# Patient Record
Sex: Female | Born: 1955 | Race: White | Hispanic: No | Marital: Married | Smoking: Current every day smoker
Health system: Southern US, Community
[De-identification: ages and names within clinical notes are randomized; demographics above are authoritative.]

## PROBLEM LIST (undated history)

## (undated) DIAGNOSIS — F329 Major depressive disorder, single episode, unspecified: Secondary | ICD-10-CM

## (undated) DIAGNOSIS — G43909 Migraine, unspecified, not intractable, without status migrainosus: Secondary | ICD-10-CM

## (undated) DIAGNOSIS — S060XAA Concussion with loss of consciousness status unknown, initial encounter: Secondary | ICD-10-CM

## (undated) DIAGNOSIS — E119 Type 2 diabetes mellitus without complications: Secondary | ICD-10-CM

## (undated) DIAGNOSIS — S060X9A Concussion with loss of consciousness of unspecified duration, initial encounter: Secondary | ICD-10-CM

## (undated) DIAGNOSIS — R42 Dizziness and giddiness: Secondary | ICD-10-CM

## (undated) DIAGNOSIS — C801 Malignant (primary) neoplasm, unspecified: Secondary | ICD-10-CM

## (undated) DIAGNOSIS — F419 Anxiety disorder, unspecified: Secondary | ICD-10-CM

## (undated) DIAGNOSIS — R5383 Other fatigue: Secondary | ICD-10-CM

## (undated) DIAGNOSIS — F32A Depression, unspecified: Secondary | ICD-10-CM

## (undated) HISTORY — PX: CHOLECYSTECTOMY: SHX55

## (undated) HISTORY — DX: Major depressive disorder, single episode, unspecified: F32.9

## (undated) HISTORY — DX: Depression, unspecified: F32.A

## (undated) HISTORY — DX: Concussion with loss of consciousness status unknown, initial encounter: S06.0XAA

## (undated) HISTORY — PX: ABDOMINAL HYSTERECTOMY: SHX81

## (undated) HISTORY — DX: Anxiety disorder, unspecified: F41.9

## (undated) HISTORY — DX: Concussion with loss of consciousness of unspecified duration, initial encounter: S06.0X9A

## (undated) HISTORY — DX: Other fatigue: R53.83

## (undated) HISTORY — PX: TRIGGER FINGER RELEASE: SHX641

## (undated) HISTORY — PX: FOOT SURGERY: SHX648

---

## 1999-01-13 ENCOUNTER — Encounter: Payer: Self-pay | Admitting: Neurosurgery

## 1999-01-13 ENCOUNTER — Ambulatory Visit (HOSPITAL_COMMUNITY): Admission: RE | Admit: 1999-01-13 | Discharge: 1999-01-13 | Payer: Self-pay | Admitting: Neurosurgery

## 2013-09-08 ENCOUNTER — Encounter (HOSPITAL_BASED_OUTPATIENT_CLINIC_OR_DEPARTMENT_OTHER): Payer: Self-pay | Admitting: Emergency Medicine

## 2013-09-08 ENCOUNTER — Emergency Department (HOSPITAL_BASED_OUTPATIENT_CLINIC_OR_DEPARTMENT_OTHER)
Admission: EM | Admit: 2013-09-08 | Discharge: 2013-09-09 | Disposition: A | Discharge status: Home / Self Care | Payer: BC Managed Care – PPO | Attending: Emergency Medicine | Admitting: Emergency Medicine

## 2013-09-08 DIAGNOSIS — J029 Acute pharyngitis, unspecified: Secondary | ICD-10-CM | POA: Insufficient documentation

## 2013-09-08 DIAGNOSIS — R51 Headache: Secondary | ICD-10-CM

## 2013-09-08 DIAGNOSIS — H9209 Otalgia, unspecified ear: Secondary | ICD-10-CM | POA: Insufficient documentation

## 2013-09-08 DIAGNOSIS — G43909 Migraine, unspecified, not intractable, without status migrainosus: Secondary | ICD-10-CM | POA: Insufficient documentation

## 2013-09-08 DIAGNOSIS — R519 Headache, unspecified: Secondary | ICD-10-CM

## 2013-09-08 DIAGNOSIS — Z79899 Other long term (current) drug therapy: Secondary | ICD-10-CM | POA: Insufficient documentation

## 2013-09-08 DIAGNOSIS — E119 Type 2 diabetes mellitus without complications: Secondary | ICD-10-CM | POA: Insufficient documentation

## 2013-09-08 DIAGNOSIS — F17200 Nicotine dependence, unspecified, uncomplicated: Secondary | ICD-10-CM | POA: Insufficient documentation

## 2013-09-08 HISTORY — DX: Migraine, unspecified, not intractable, without status migrainosus: G43.909

## 2013-09-08 HISTORY — DX: Type 2 diabetes mellitus without complications: E11.9

## 2013-09-08 NOTE — ED Provider Notes (Addendum)
CSN: [id number]     Arrival date & time 09/08/13  2141 History   This chart was scribed for Lilyana Lippman Alfonso Patten, MD by Roxan Diesel, ED scribe.  This patient was seen in room MH01/MH01 and the patient's care was started at 11:59 PM.    Chief Complaint  Patient presents with  . Migraine    Patient is a 58 y.o. female presenting with headaches. The history is provided by the patient. No language interpreter was used.  Headache Pain location:  Generalized Quality:  Dull Radiates to:  Does not radiate Onset quality:  Gradual Duration: 4-5 months. Timing:  Intermittent Progression:  Unchanged Chronicity:  Recurrent (recurrent for past 4-5 months) Similar to prior headaches: yes   Context: not activity   Relieved by:  Nothing Worsened by:  Nothing tried Ineffective treatments: norco. Associated symptoms: ear pain, myalgias and sore throat   Associated symptoms: no cough, no diarrhea, no fever, no neck stiffness, no numbness, no photophobia, no swollen glands and no vomiting   Risk factors: no anger     HPI Comments: Julie Chen is a 58 y.o. female who presents to the Emergency Department complaining of a recurrent headaches that have been going on for the past 4 months. She also reports a sore throat, ear ache and generalized body aches. Patient reports taking OTC medications for these symptoms without relief. She denies vomiting, diarrhea, constipation, or rash.  She states that she takes Vicodin daily, typically before she goes to bed. She denies taking OTC ibuprofen or Aleve.   Past Medical History  Diagnosis Date  . Migraine   . Diabetes mellitus without complication    Past Surgical History  Procedure Laterality Date  . Abdominal hysterectomy    . Cholecystectomy    . Trigger finger release     History reviewed. No pertinent family history. History  Substance Use Topics  . Smoking status: Current Every Day Smoker  . Smokeless tobacco: Never Used  . Alcohol Use:  Yes   OB History   Grav Para Term Preterm Abortions TAB SAB Ect Mult Living                 Review of Systems  Constitutional: Negative for fever.  HENT: Positive for ear pain and sore throat. Negative for trouble swallowing and voice change.   Eyes: Negative for photophobia.  Respiratory: Negative for cough.   Gastrointestinal: Negative for vomiting, diarrhea and constipation.  Musculoskeletal: Positive for myalgias. Negative for neck stiffness.  Skin: Negative for rash.  Neurological: Positive for headaches. Negative for weakness and numbness.  All other systems reviewed and are negative.     Allergies  Phenergan and Sulfa antibiotics  Home Medications   Prior to Admission medications   Medication Sig Start Date End Date Taking? Authorizing Provider  escitalopram (LEXAPRO) 20 MG tablet Take 20 mg by mouth daily.   Yes Historical Provider, MD  folic acid (FOLVITE) 1 MG tablet Take 1 mg by mouth daily.   Yes Historical Provider, MD  gabapentin (NEURONTIN) 300 MG capsule Take 300 mg by mouth 4 (four) times daily.   Yes Historical Provider, MD  hydrOXYzine (VISTARIL) 25 MG capsule Take 25 mg by mouth 3 (three) times daily as needed.   Yes Historical Provider, MD  magnesium 30 MG tablet Take 250 mg by mouth 2 (two) times daily.   Yes Historical Provider, MD  meclizine (ANTIVERT) 25 MG tablet Take 25 mg by mouth 3 (three) times daily as needed for  dizziness.   Yes Historical Provider, MD  montelukast (SINGULAIR) 10 MG tablet Take 10 mg by mouth at bedtime.   Yes Historical Provider, MD  omeprazole (PRILOSEC) 20 MG capsule Take 20 mg by mouth daily.   Yes Historical Provider, MD  rosuvastatin (CRESTOR) 40 MG tablet Take 40 mg by mouth daily.   Yes Historical Provider, MD  vitamin A 7500 UNIT capsule Take 7,500 Units by mouth daily.   Yes Historical Provider, MD   BP 101/64  Pulse 102  Temp(Src) 98.4 F (36.9 C) (Oral)  Resp 18  SpO2 98% Physical Exam  Nursing note and vitals  reviewed. Constitutional: She is oriented to person, place, and time. She appears well-developed and well-nourished. No distress.  Cognition and phonation intact  HENT:  Head: Normocephalic and atraumatic. Head is without raccoon's eyes and without Battle's sign.  Right Ear: Tympanic membrane, external ear and ear canal normal. No mastoid tenderness. No hemotympanum.  Left Ear: Tympanic membrane, external ear and ear canal normal. No mastoid tenderness. No hemotympanum.  Mouth/Throat: Oropharynx is clear and moist and mucous membranes are normal. No trismus in the jaw. No uvula swelling. No oropharyngeal exudate.  no tenderness of the mastoids No swelling to lips, ears or nose  Eyes: Conjunctivae and EOM are normal. Pupils are equal, round, and reactive to light.  Neck: Normal range of motion. Neck supple. No tracheal deviation present.  No pain with displacement of the trachea  Cardiovascular: Normal rate and regular rhythm.   No lymph nodes.   Pulmonary/Chest: Effort normal and breath sounds normal. No stridor. No respiratory distress. She has no wheezes. She has no rales.  Abdominal: Soft. Bowel sounds are normal. There is no tenderness. There is no rebound and no guarding.  Musculoskeletal: Normal range of motion. She exhibits no edema and no tenderness.  Lymphadenopathy:    She has no cervical adenopathy.  Neurological: She is alert and oriented to person, place, and time. She has normal reflexes. No cranial nerve deficit.  Skin: Skin is warm and dry.  Psychiatric: She has a normal mood and affect. Her behavior is normal.    ED Course  Procedures (including critical care time) DIAGNOSTIC STUDIES: Oxygen Saturation is 98% on room air, normal by my interpretation.    COORDINATION OF CARE: 12:05 AM-Discussed treatment plan which includes neck x-ray, head CT, and labs with pt at bedside and pt agreed to plan.     Labs Review Labs Reviewed - No data to display  Imaging  Review No results found.   EKG Interpretation None      MDM   Final diagnoses:  None  No signs of trauma from fall or sinus infection on head CT.  No meningismus.  No indication for LP.  No  Concern for dural sinus thrombosis as able to look in all directions and and no changes in cognition.    Symptoms have been going on for months.  Both head CT and soft tissue neck xray are negative.  Strep is negative.  No lymph nodes.  No pain with displacement of the trachea.  Centor criteria negative.  No indication for antibiotics at this time.  Recommend follow up in 2 days with your family doctor, prn follow up with ENT if symptoms persist.  Patient is taking daily norco for the past 4-5 months for her symptoms  I personally performed the services described in this documentation, which was scribed in my presence. The recorded information has been reviewed and is  accurate. 2 scribes present for this exam and physical    Jaelan Rasheed K Tymarion Everard-Rasch, MD 09/09/13 0240  Judia Arnott K Jachin Coury-Rasch, MD 09/09/13 [phone]

## 2013-09-08 NOTE — ED Notes (Signed)
Pt reports migraine headache since fall Monday PM denies blurred vision or other nero symptoms

## 2013-09-09 ENCOUNTER — Emergency Department (HOSPITAL_BASED_OUTPATIENT_CLINIC_OR_DEPARTMENT_OTHER): Payer: BC Managed Care – PPO

## 2013-09-09 LAB — RAPID STREP SCREEN (MED CTR MEBANE ONLY): STREPTOCOCCUS, GROUP A SCREEN (DIRECT): NEGATIVE

## 2013-09-09 MED ORDER — KETOROLAC TROMETHAMINE 60 MG/2ML IM SOLN
60.0000 mg | Freq: Once | INTRAMUSCULAR | Status: AC
  Administered 2013-09-09: 60 mg via INTRAMUSCULAR
  Filled 2013-09-09: qty 2

## 2013-09-09 MED ORDER — SUCRALFATE 1 GM/10ML PO SUSP: 1.0000 g | Freq: Three times a day (TID) | ORAL | Status: DC

## 2013-09-09 MED ORDER — DEXAMETHASONE SODIUM PHOSPHATE 10 MG/ML IJ SOLN
10.0000 mg | Freq: Once | INTRAMUSCULAR | Status: AC
  Administered 2013-09-09: 10 mg via INTRAMUSCULAR
  Filled 2013-09-09: qty 1

## 2013-09-09 MED ORDER — DIPHENHYDRAMINE HCL 50 MG/ML IJ SOLN
12.5000 mg | Freq: Once | INTRAMUSCULAR | Status: AC
  Administered 2013-09-09: 12.5 mg via INTRAMUSCULAR
  Filled 2013-09-09: qty 1

## 2013-09-09 MED ORDER — METOCLOPRAMIDE HCL 5 MG/ML IJ SOLN
10.0000 mg | Freq: Once | INTRAMUSCULAR | Status: AC
  Administered 2013-09-09: 10 mg via INTRAMUSCULAR
  Filled 2013-09-09: qty 2

## 2013-09-09 MED ORDER — MELOXICAM 7.5 MG PO TABS: 7.5000 mg | ORAL_TABLET | Freq: Every day | ORAL | Status: DC

## 2013-09-10 LAB — CULTURE, GROUP A STREP

## 2013-10-01 ENCOUNTER — Encounter: Payer: Self-pay | Admitting: Neurology

## 2013-10-01 ENCOUNTER — Ambulatory Visit (INDEPENDENT_AMBULATORY_CARE_PROVIDER_SITE_OTHER): Payer: BC Managed Care – PPO | Admitting: Neurology

## 2013-10-01 VITALS — Ht 67.0 in | Wt 171.0 lb

## 2013-10-01 DIAGNOSIS — S060X9A Concussion with loss of consciousness of unspecified duration, initial encounter: Secondary | ICD-10-CM | POA: Insufficient documentation

## 2013-10-01 DIAGNOSIS — R55 Syncope and collapse: Secondary | ICD-10-CM | POA: Insufficient documentation

## 2013-10-01 DIAGNOSIS — F411 Generalized anxiety disorder: Secondary | ICD-10-CM

## 2013-10-01 DIAGNOSIS — F419 Anxiety disorder, unspecified: Secondary | ICD-10-CM

## 2013-10-01 DIAGNOSIS — F3289 Other specified depressive episodes: Secondary | ICD-10-CM

## 2013-10-01 DIAGNOSIS — F329 Major depressive disorder, single episode, unspecified: Secondary | ICD-10-CM

## 2013-10-01 DIAGNOSIS — F32A Depression, unspecified: Secondary | ICD-10-CM

## 2013-10-01 DIAGNOSIS — R5381 Other malaise: Secondary | ICD-10-CM

## 2013-10-01 DIAGNOSIS — S060XAA Concussion with loss of consciousness status unknown, initial encounter: Secondary | ICD-10-CM | POA: Insufficient documentation

## 2013-10-01 DIAGNOSIS — R5383 Other fatigue: Secondary | ICD-10-CM | POA: Insufficient documentation

## 2013-10-01 DIAGNOSIS — G43909 Migraine, unspecified, not intractable, without status migrainosus: Secondary | ICD-10-CM | POA: Insufficient documentation

## 2013-10-01 MED ORDER — RIZATRIPTAN BENZOATE 5 MG PO TBDP: 5.0000 mg | ORAL_TABLET | ORAL | Status: AC | PRN

## 2013-10-01 NOTE — Progress Notes (Signed)
PATIENT: Julie Chen DOB: Feb 24, 1956  HISTORICAL  Tasheema Perrone is a 58 yo left handed female, is referred by her primary care physician Dr. 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  She began to complains of depression, anxiety, frequent headaches, often starting from occipital, bilateral upper shoulder, spreading forward, to bilateral frontal area, pressure, 3-5 times each week, it would exacerbated to a much more severe headaches, severe pounding, with associated light noise sensitivity, she is taking hydrocodone as needed, has never tried triptan treatment in the past, she reported a history of migraine headaches when she was young.  She is taking Lexapro 20 mg daily, also taking Xanax 3-4 times each day, which helps her somewhat,  She had 4 passing out episodes over past 1 month, since June 2015, one episode is associated with hyperventilation, panicky feelings, chest pain, the other episode is associated as are unclear triggers, she was sitting there watching TV, was found by her husband on the floor.  There was one episode happened after blood sample was taken  Most bothersome symptoms are constant neck pain, upper shoulder area discomfort, which has been helped by chiropractor  REVIEW OF SYSTEMS: Full 14 system review of systems performed and notable only for aching muscles, allergies, headaches, numbness, dizziness, passing out, sleepiness, depression, anxiety, decreased energy, change in appetite, disinterested in activities,  ALLERGIES: Allergies  Allergen Reactions  . Phenergan [Promethazine Hcl]   . Sulfa Antibiotics     HOME MEDICATIONS: Current Outpatient Prescriptions on File Prior to Visit  Medication Sig Dispense Refill  . escitalopram (LEXAPRO)  20 MG tablet Take 20 mg by mouth daily.      . folic acid (FOLVITE) 1 MG tablet Take 1 mg by mouth daily.      Marland Kitchen gabapentin (NEURONTIN) 300 MG capsule Take 300 mg by mouth 4 (four) times daily.      . hydrOXYzine (VISTARIL) 25 MG capsule Take 25 mg by mouth 3 (three) times daily as needed.      . magnesium 30 MG tablet Take 250 mg by mouth 2 (two) times daily.      . meclizine (ANTIVERT) 25 MG tablet Take 25 mg by mouth 3 (three) times daily as needed for dizziness.      . montelukast (SINGULAIR) 10 MG tablet Take 10 mg by mouth at bedtime.      . rosuvastatin (CRESTOR) 40 MG tablet Take 40 mg by mouth daily.      . vitamin A 7500 UNIT capsule Take 7,500 Units by mouth daily.       No current facility-administered medications on file prior to visit.    PAST MEDICAL HISTORY: Past Medical History  Diagnosis Date  . Migraine   . Diabetes mellitus without complication   . Concussion   . Depression   . Anxiety   . Fatigue     PAST SURGICAL HISTORY: Past Surgical History  Procedure Laterality Date  . Abdominal hysterectomy    . Cholecystectomy    . Trigger finger release    . Cesarean section      X2  . Foot surgery Bilateral     FAMILY HISTORY: Family History  Problem Relation Age of Onset  . Clotting disorder Mother   . Alzheimer's disease Father   .  Heart Problems Brother   . Stroke Mother   . Osteoporosis Mother   . Migraines Father     SOCIAL HISTORY:  History   Social History  . Marital Status: Married    Spouse Name: Lewis    Number of Children: 2  . Years of Education: 12th   Occupational History    [employer]   Social History Main Topics  . Smoking status: Current Every Day Smoker  . Smokeless tobacco: Never Used  . Alcohol Use: No  . Drug Use: No  . Sexual Activity: Not on file   Other Topics Concern  . Not on file   Social History Narrative   Patient lives at home with her husband Bobby Rumpf). Patient works full time for [employer].    Education high school.   Left handed.   Caffeine two glasses daily.     PHYSICAL EXAM   Filed Vitals:   10/01/13 1008  Height: 5\' 7"  (1.702 m)  Weight: 171 lb (77.565 kg)    Not recorded    Body mass index is 26.78 kg/(m^2).   Generalized: In no acute distress  Neck: Supple, no carotid bruits   Cardiac: Regular rate rhythm  Pulmonary: Clear to auscultation bilaterally  Musculoskeletal: No deformity  Neurological examination  Mentation: Alert oriented to time, place, history taking, and causual conversation, tearful during today's conversation  Cranial nerve II-XII: Pupils were equal round reactive to light. Extraocular movements were full.  Visual field were full on confrontational test. Bilateral fundi were sharp.  Facial sensation and strength were normal. Hearing was intact to finger rubbing bilaterally. Uvula tongue midline.  Head turning and shoulder shrug and were normal and symmetric.Tongue protrusion into cheek strength was normal.  Motor: Normal tone, bulk and strength.  Sensory: Intact to fine touch, pinprick, preserved vibratory sensation, and proprioception at toes.  Coordination: Normal finger to nose, heel-to-shin bilaterally there was no truncal ataxia  Gait: Rising up from seated position without assistance, normal stance, without trunk ataxia, moderate stride, good arm swing, smooth turning, able to perform tiptoe, and heel walking without difficulty.   Romberg signs: Negative  Deep tendon reflexes: Brachioradialis 2/2, biceps 2/2, triceps 2/2, patellar 2/2, Achilles 2/2, plantar responses were flexor bilaterally.   DIAGNOSTIC DATA (LABS, IMAGING, TESTING) - I reviewed patient records, labs, notes, testing and imaging myself where available.   ASSESSMENT AND PLAN  Skyy Nilan is a 58 y.o. female complains of episodes of passing out, suggestive of syncope, frequent headaches, with some migraine features,  1, complete evaluation with MRI of the  brain with and without contrast, to rule out structural lesion in a 58 years old, with new onset episode of sudden known loss of consciousness, EEG 2. Keep current medications, including Prozac, Xanax, 3. Return to clinic in one month, 4. Maxalt as needed for migraine     Marcial Pacas, M.D. Ph.D.  [facility] [address], [place], [place] [address] [phone]

## 2013-10-08 ENCOUNTER — Ambulatory Visit (INDEPENDENT_AMBULATORY_CARE_PROVIDER_SITE_OTHER): Payer: BC Managed Care – PPO | Admitting: Radiology

## 2013-10-08 ENCOUNTER — Other Ambulatory Visit: Payer: BC Managed Care – PPO | Admitting: Radiology

## 2013-10-08 DIAGNOSIS — R55 Syncope and collapse: Secondary | ICD-10-CM

## 2013-10-08 DIAGNOSIS — R5383 Other fatigue: Secondary | ICD-10-CM

## 2013-10-08 DIAGNOSIS — F32A Depression, unspecified: Secondary | ICD-10-CM

## 2013-10-08 DIAGNOSIS — F329 Major depressive disorder, single episode, unspecified: Secondary | ICD-10-CM

## 2013-10-08 DIAGNOSIS — F419 Anxiety disorder, unspecified: Secondary | ICD-10-CM

## 2013-10-08 NOTE — Procedures (Signed)
   HISTORY: 58 years old female complaints of passing out episode frequent headaches  TECHNIQUE:  16 channel EEG was performed based on standard 10-16 international system. One channel was dedicated to EKG, which has demonstrates normal sinus rhythm of 72 beats per minutes.  Upon awakening, the posterior background activity was well-developed, in alpha range, 9-10 Hz, with amplitude of 45 microvoltage, reactive to eye opening and closure.  There was no evidence of epileptiform discharge.  Photic stimulation was performed, which induced a symmetric photic driving.  Hyperventilation was performed, there was no abnormality elicit.  Stage II sleep was achieved.  CONCLUSION: This is a  normal awake and sleep EEG.  There is no electrodiagnostic evidence of epileptiform discharge

## 2013-10-13 DIAGNOSIS — R51 Headache: Secondary | ICD-10-CM

## 2013-10-17 ENCOUNTER — Other Ambulatory Visit: Payer: Self-pay | Admitting: Diagnostic Neuroimaging

## 2013-10-17 DIAGNOSIS — F329 Major depressive disorder, single episode, unspecified: Secondary | ICD-10-CM

## 2013-10-17 DIAGNOSIS — F419 Anxiety disorder, unspecified: Secondary | ICD-10-CM

## 2013-10-17 DIAGNOSIS — R55 Syncope and collapse: Secondary | ICD-10-CM

## 2013-10-17 DIAGNOSIS — F32A Depression, unspecified: Secondary | ICD-10-CM

## 2013-10-17 DIAGNOSIS — R5383 Other fatigue: Secondary | ICD-10-CM

## 2013-10-18 ENCOUNTER — Telehealth: Payer: Self-pay | Admitting: Neurology

## 2013-10-18 NOTE — Telephone Encounter (Signed)
Patient requesting EEG results. °

## 2013-10-18 NOTE — Telephone Encounter (Signed)
Kindly inform patient that EEG is normal

## 2013-10-18 NOTE — Telephone Encounter (Signed)
Pt calling requesting EEG results. Dr. Krista Blue out of the office. Sending to [facility], Dr. Leonie Man, please advise

## 2013-10-21 NOTE — Telephone Encounter (Signed)
Called pt to inform her per Dr. Leonie Man, Dr. Krista Blue out of the office, that the pt's EEG results were normal. Pt is also requesting MRI results and sent to pt's MyChart.  I advised the pt that if she has any other problems, questions or concerns to call the office. Pt verbalized understanding. Please advise

## 2013-10-21 NOTE — Telephone Encounter (Signed)
Please call patient. MRI brain showed no significant abnormalities, further questions can be addressed at her follow up visit in August 7th    MRI brain (with and without) demonstrating: 1. Minimal periventricular and subcortical foci of non-specific gliosis.  2. No abnormal lesions are seen on post contrast views.  3. No acute findings.

## 2013-10-21 NOTE — Telephone Encounter (Signed)
Called pt to inform her per Dr. Krista Blue that her MRI results showed no significant abnormalities and further questions can be addressed at pt f/u visit on 11/01/13. I advised the pt that if she has any other problems, questions or concerns to call the office. Pt verbalized understanding.

## 2013-10-31 NOTE — Telephone Encounter (Signed)
Noted  

## 2013-11-01 ENCOUNTER — Ambulatory Visit: Payer: BC Managed Care – PPO | Admitting: Neurology

## 2014-01-10 ENCOUNTER — Other Ambulatory Visit: Payer: Self-pay

## 2014-06-01 ENCOUNTER — Encounter (HOSPITAL_BASED_OUTPATIENT_CLINIC_OR_DEPARTMENT_OTHER): Payer: Self-pay | Admitting: *Deleted

## 2014-06-01 ENCOUNTER — Emergency Department (HOSPITAL_BASED_OUTPATIENT_CLINIC_OR_DEPARTMENT_OTHER)
Admission: EM | Admit: 2014-06-01 | Discharge: 2014-06-01 | Disposition: A | Discharge status: Home / Self Care | Payer: No Typology Code available for payment source | Attending: Emergency Medicine | Admitting: Emergency Medicine

## 2014-06-01 DIAGNOSIS — Z720 Tobacco use: Secondary | ICD-10-CM | POA: Diagnosis not present

## 2014-06-01 DIAGNOSIS — F329 Major depressive disorder, single episode, unspecified: Secondary | ICD-10-CM | POA: Insufficient documentation

## 2014-06-01 DIAGNOSIS — F419 Anxiety disorder, unspecified: Secondary | ICD-10-CM | POA: Insufficient documentation

## 2014-06-01 DIAGNOSIS — E119 Type 2 diabetes mellitus without complications: Secondary | ICD-10-CM | POA: Diagnosis not present

## 2014-06-01 DIAGNOSIS — Z87828 Personal history of other (healed) physical injury and trauma: Secondary | ICD-10-CM | POA: Insufficient documentation

## 2014-06-01 DIAGNOSIS — Z79899 Other long term (current) drug therapy: Secondary | ICD-10-CM | POA: Insufficient documentation

## 2014-06-01 DIAGNOSIS — M542 Cervicalgia: Secondary | ICD-10-CM | POA: Insufficient documentation

## 2014-06-01 DIAGNOSIS — G43909 Migraine, unspecified, not intractable, without status migrainosus: Secondary | ICD-10-CM | POA: Diagnosis not present

## 2014-06-01 DIAGNOSIS — M791 Myalgia: Secondary | ICD-10-CM | POA: Diagnosis not present

## 2014-06-01 DIAGNOSIS — Z791 Long term (current) use of non-steroidal anti-inflammatories (NSAID): Secondary | ICD-10-CM | POA: Insufficient documentation

## 2014-06-01 DIAGNOSIS — R51 Headache: Secondary | ICD-10-CM | POA: Diagnosis present

## 2014-06-01 DIAGNOSIS — R519 Headache, unspecified: Secondary | ICD-10-CM

## 2014-06-01 DIAGNOSIS — Z9889 Other specified postprocedural states: Secondary | ICD-10-CM | POA: Insufficient documentation

## 2014-06-01 MED ORDER — DEXAMETHASONE SODIUM PHOSPHATE 10 MG/ML IJ SOLN
10.0000 mg | Freq: Once | INTRAMUSCULAR | Status: AC
  Administered 2014-06-01: 10 mg via INTRAMUSCULAR
  Filled 2014-06-01: qty 1

## 2014-06-01 MED ORDER — DIPHENHYDRAMINE HCL 50 MG/ML IJ SOLN
25.0000 mg | Freq: Once | INTRAMUSCULAR | Status: AC
  Administered 2014-06-01: 25 mg via INTRAMUSCULAR
  Filled 2014-06-01: qty 1

## 2014-06-01 MED ORDER — KETOROLAC TROMETHAMINE 60 MG/2ML IM SOLN
60.0000 mg | Freq: Once | INTRAMUSCULAR | Status: AC
  Administered 2014-06-01: 60 mg via INTRAMUSCULAR
  Filled 2014-06-01: qty 2

## 2014-06-01 MED ORDER — METOCLOPRAMIDE HCL 5 MG/ML IJ SOLN
10.0000 mg | Freq: Once | INTRAMUSCULAR | Status: AC
  Administered 2014-06-01: 10 mg via INTRAMUSCULAR
  Filled 2014-06-01: qty 2

## 2014-06-01 NOTE — ED Notes (Signed)
C/o h/a x 2 weeks from base of skull to top of head. Has hx of migraines but does not feel like her typical migraine. Nauseated no vomiting. Sensitive to light and noise.

## 2014-06-01 NOTE — ED Provider Notes (Signed)
CSN: [id number]     Arrival date & time 06/01/14  1342 History  This chart was scribed for Malvin Johns, MD by Molli Posey, ED Scribe. This patient was seen in room MH04/MH04 and the patient's care was started 3:29 PM.      Chief Complaint  Patient presents with  . Headache   HPI HPI Comments: Julie Chen is a 59 y.o. female with a history of migraine, DM and concussion who presents to the Emergency Department complaining of a waxing and waning headache for the last 2 weeks. Pt reports her HA has been constant the last 4 days and says she has pain from her shoulders to the top of her head along the backside of her neck. She reports a similar HA that she presented to the ED for 1 year ago. Pt reports that she recently had a MRI of her head that was normal due to syncope incidents. She states that she was prescribed medications after that time that have not been helping her HA symptoms. Pt reports that her "words were changing in her sentences" 2 days ago.  She only noticed these symptoms 1 day and she hasn't had any other speech deficits. She states that she has been taking gabapentin and vicodin which have failed to relieve her symptoms. She denies vomiting, fever, numbness in extremities or changes in vision.  No ataxia.  No recent head injuries.   Past Medical History  Diagnosis Date  . Migraine   . Diabetes mellitus without complication   . Concussion   . Depression   . Anxiety   . Fatigue    Past Surgical History  Procedure Laterality Date  . Abdominal hysterectomy    . Cholecystectomy    . Trigger finger release    . Cesarean section      X2  . Foot surgery Bilateral    Family History  Problem Relation Age of Onset  . Clotting disorder Mother   . Alzheimer's disease Father   . Heart Problems Brother   . Stroke Mother   . Osteoporosis Mother   . Migraines Father    History  Substance Use Topics  . Smoking status: Current Every Day Smoker -- 1.00 packs/day    Types:  Cigarettes  . Smokeless tobacco: Never Used  . Alcohol Use: No   OB History    No data available     Review of Systems  Constitutional: Negative for fever, chills, diaphoresis and fatigue.  HENT: Negative for congestion, rhinorrhea and sneezing.   Eyes: Negative.  Negative for visual disturbance.  Respiratory: Negative for cough, chest tightness and shortness of breath.   Cardiovascular: Negative for chest pain and leg swelling.  Gastrointestinal: Positive for nausea. Negative for vomiting, abdominal pain, diarrhea and blood in stool.  Genitourinary: Negative for frequency, hematuria, flank pain and difficulty urinating.  Musculoskeletal: Positive for myalgias and neck pain. Negative for back pain and arthralgias.  Skin: Negative for rash.  Neurological: Positive for headaches. Negative for dizziness, speech difficulty, weakness and numbness.  All other systems reviewed and are negative.     Allergies  Phenergan and Sulfa antibiotics  Home Medications   Prior to Admission medications   Medication Sig Start Date End Date Taking? Authorizing Provider  ALPRAZolam Duanne Moron) 0.25 MG tablet Take 0.25 mg by mouth at bedtime as needed for anxiety.   Yes Historical Provider, MD  Calcium Acetate, Phos Binder, (CALCIUM ACETATE PO) Take by mouth daily.   Yes Historical Provider, MD  escitalopram (LEXAPRO) 20 MG tablet Take 20 mg by mouth daily.   Yes Historical Provider, MD  Fish Oil OIL by Does not apply route daily.   Yes Historical Provider, MD  gabapentin (NEURONTIN) 300 MG capsule Take 300 mg by mouth 4 (four) times daily.   Yes Historical Provider, MD  HYDROcodone-acetaminophen (NORCO/VICODIN) 5-325 MG per tablet Take 1 tablet by mouth every 6 (six) hours as needed for moderate pain.   Yes Historical Provider, MD  meloxicam (MOBIC) 7.5 MG tablet Take 7.5 mg by mouth daily.   Yes Historical Provider, MD  folic acid (FOLVITE) 1 MG tablet Take 1 mg by mouth daily.    Historical Provider,  MD  hydrOXYzine (VISTARIL) 25 MG capsule Take 25 mg by mouth 3 (three) times daily as needed.    Historical Provider, MD  magnesium 30 MG tablet Take 250 mg by mouth 2 (two) times daily.    Historical Provider, MD  meclizine (ANTIVERT) 25 MG tablet Take 25 mg by mouth 3 (three) times daily as needed for dizziness.    Historical Provider, MD  montelukast (SINGULAIR) 10 MG tablet Take 10 mg by mouth at bedtime.    Historical Provider, MD  rizatriptan (MAXALT-MLT) 5 MG disintegrating tablet Take 1 tablet (5 mg total) by mouth as needed for migraine. May repeat in 2 hours if needed 10/01/13   Marcial Pacas, MD  rosuvastatin (CRESTOR) 40 MG tablet Take 40 mg by mouth daily.    Historical Provider, MD  vitamin A 7500 UNIT capsule Take 7,500 Units by mouth daily.    Historical Provider, MD   BP 116/64 mmHg  Pulse 65  Temp(Src) 98.1 F (36.7 C) (Oral)  Resp 16  Ht 5\' 8"  (1.727 m)  Wt 170 lb (77.111 kg)  BMI 25.85 kg/m2  SpO2 99% Physical Exam  Constitutional: She is oriented to person, place, and time. She appears well-developed and well-nourished.  HENT:  Head: Normocephalic and atraumatic.  Eyes: Pupils are equal, round, and reactive to light. Right eye exhibits no discharge. Left eye exhibits no discharge.  Neck: Normal range of motion. Neck supple. No tracheal deviation present.  Cardiovascular: Normal rate, regular rhythm and normal heart sounds.   Pulmonary/Chest: Effort normal and breath sounds normal. No respiratory distress. She has no wheezes. She has no rales. She exhibits no tenderness.  Abdominal: Soft. Bowel sounds are normal. She exhibits no distension. There is no tenderness. There is no rebound and no guarding.  Musculoskeletal: Normal range of motion. She exhibits no edema.  Patient has some soreness across the paraspinal muscles in the neck and upper shoulders bilaterally. There is no spinal tenderness.  Lymphadenopathy:    She has no cervical adenopathy.  Neurological: She is  alert and oriented to person, place, and time.  Motor 5 out of 5 all extremities. Sensation grossly intact to light touch all extremities. Finger-nose intact. No pronator drift. Cranial nerves II through XII grossly intact  Skin: Skin is warm and dry. No rash noted.  Psychiatric: She has a normal mood and affect. Her behavior is normal.  Nursing note and vitals reviewed.   ED Course  Procedures   DIAGNOSTIC STUDIES: Oxygen Saturation is 98% on RA, normal by my interpretation.    COORDINATION OF CARE: 3:39 PM Discussed treatment plan with pt at bedside and pt agreed to plan.  Labs Review Labs Reviewed - No data to display  Imaging Review No results found.   EKG Interpretation None  MDM   Final diagnoses:  Headache, unspecified headache type   Patient presents with a headache and neck pain similar to an episode of headache that she had about a year ago. The symptoms of been going on for about 2 weeks and then waxing and waning. Her symptoms are not consistent with a subarachnoid hemorrhage or meningitis. She's had recent imaging of her head and I don't feel that she needs repeat imaging today. She was given a migraine cocktail and fills 100% better after this. She's ready to go home. She will follow-up with her neurologist for ongoing headache management. I advised to return if she has any worsening headache.  I personally performed the services described in this documentation, which was scribed in my presence.  The recorded information has been reviewed and considered.      Malvin Johns, MD 06/01/14 [phone]

## 2014-06-01 NOTE — Discharge Instructions (Signed)

## 2014-12-07 ENCOUNTER — Emergency Department (HOSPITAL_COMMUNITY)
Admission: EM | Admit: 2014-12-07 | Discharge: 2014-12-07 | Disposition: A | Discharge status: Home / Self Care | Payer: No Typology Code available for payment source | Attending: Emergency Medicine | Admitting: Emergency Medicine

## 2014-12-07 ENCOUNTER — Emergency Department (HOSPITAL_COMMUNITY): Payer: No Typology Code available for payment source

## 2014-12-07 ENCOUNTER — Encounter (HOSPITAL_COMMUNITY): Payer: Self-pay | Admitting: Emergency Medicine

## 2014-12-07 DIAGNOSIS — Y9289 Other specified places as the place of occurrence of the external cause: Secondary | ICD-10-CM | POA: Diagnosis not present

## 2014-12-07 DIAGNOSIS — E119 Type 2 diabetes mellitus without complications: Secondary | ICD-10-CM | POA: Insufficient documentation

## 2014-12-07 DIAGNOSIS — X58XXXA Exposure to other specified factors, initial encounter: Secondary | ICD-10-CM | POA: Diagnosis not present

## 2014-12-07 DIAGNOSIS — Z791 Long term (current) use of non-steroidal anti-inflammatories (NSAID): Secondary | ICD-10-CM | POA: Insufficient documentation

## 2014-12-07 DIAGNOSIS — F419 Anxiety disorder, unspecified: Secondary | ICD-10-CM | POA: Insufficient documentation

## 2014-12-07 DIAGNOSIS — Z79899 Other long term (current) drug therapy: Secondary | ICD-10-CM | POA: Diagnosis not present

## 2014-12-07 DIAGNOSIS — Z720 Tobacco use: Secondary | ICD-10-CM | POA: Insufficient documentation

## 2014-12-07 DIAGNOSIS — G43909 Migraine, unspecified, not intractable, without status migrainosus: Secondary | ICD-10-CM | POA: Insufficient documentation

## 2014-12-07 DIAGNOSIS — Y9389 Activity, other specified: Secondary | ICD-10-CM | POA: Insufficient documentation

## 2014-12-07 DIAGNOSIS — S99921A Unspecified injury of right foot, initial encounter: Secondary | ICD-10-CM | POA: Diagnosis present

## 2014-12-07 DIAGNOSIS — Y998 Other external cause status: Secondary | ICD-10-CM | POA: Insufficient documentation

## 2014-12-07 DIAGNOSIS — S92351A Displaced fracture of fifth metatarsal bone, right foot, initial encounter for closed fracture: Secondary | ICD-10-CM | POA: Insufficient documentation

## 2014-12-07 DIAGNOSIS — S92331A Displaced fracture of third metatarsal bone, right foot, initial encounter for closed fracture: Secondary | ICD-10-CM | POA: Diagnosis not present

## 2014-12-07 DIAGNOSIS — S92301A Fracture of unspecified metatarsal bone(s), right foot, initial encounter for closed fracture: Secondary | ICD-10-CM

## 2014-12-07 DIAGNOSIS — F329 Major depressive disorder, single episode, unspecified: Secondary | ICD-10-CM | POA: Diagnosis not present

## 2014-12-07 MED ORDER — HYDROCODONE-ACETAMINOPHEN 5-325 MG PO TABS: 1.0000 | ORAL_TABLET | Freq: Four times a day (QID) | ORAL | Status: AC | PRN

## 2014-12-07 MED ORDER — HYDROCODONE-ACETAMINOPHEN 5-325 MG PO TABS
2.0000 | ORAL_TABLET | Freq: Once | ORAL | Status: AC
  Administered 2014-12-07: 2 via ORAL
  Filled 2014-12-07: qty 2

## 2014-12-07 NOTE — ED Notes (Signed)
PA at bedside.

## 2014-12-07 NOTE — Discharge Instructions (Signed)
Metatarsal Fracture  with Rehab A metatarsal fracture is a break (fracture) of one of the bones of the mid-foot (metatarsal bones). The metatarsal bones are responsible for maintaining the arch of the foot. There are three classifications of metatarsal fractures: dancer's fractures, Jones fractures, and stress fractures. A dancer's fracture is when a piece of bone is pulled off by a ligament or tendon (avulsion fracture) of the outer part of the foot (fifth metatarsal), near the joint with the ankle bones. A Jones fracture occurs in the middle of the fifth metatarsal. These fractures have limited ability to heal. A stress fracture occurs when the bone is slowly injured faster than it can repair itself. SYMPTOMS   Sharp pain, especially with standing or walking.  Tenderness, swelling, and later bruising (contusion) of the foot.  Numbness or paralysis from swelling in the foot, causing pressure on the blood vessels or nerves (uncommon). CAUSES  Fractures occur when a force is placed on the bone that is greater than it can handle. Common causes of injury include:  Direct hit (trauma) to the foot.  Twisting injury to the foot or ankle.  Landing on the foot and ankle in an improper position. RISK INCRESES WITH:  Participation in contact sports, sports that require jumping and landing, or sports in which cleats are worn and sliding occurs.  Previous foot or ankle sprains or dislocations.  Repeated injury to any joint in the foot.  Poor strength and flexibility. PREVENTION  Warm up and stretch properly before an activity.  Allow for adequate recovery between workouts.  Maintain physical fitness in:  Strength, flexibility, and endurance.  Cardiovascular fitness.  When participating in jumping or contact sports, protect joints with supportive devices, such as wrapped elastic bandages, tape, braces, or high-top athletic shoes.  Wear properly fitted and padded protective  equipment. PROGNOSIS If treated properly, metatarsal fractures usually heal well. Jones fractures have a higher risk of the bone failing to heal (nonunion). Sometimes, surgery is needed to heal Jones fractures.  RELATED COMPLICATIONS   Nonunion.  Fracture heals in a poor position (malunion).  Long-term (chronic) pain, stiffness, or swelling of the foot.  Excessive bleeding in the foot or at the dislocation site, causing pressure and injury to nerves and blood vessels (rare).  Unstable or arthritic joint following repeated injury or delayed treatment. TREATMENT  Treatment first involves the use of ice and medicine, to reduce pain and inflammation. If the bone fragments are out of alignment (displaced), then immediate realigning of the bones (reduction) is required. Fractures that cannot be realigned by hand, or where the bones protrude through the skin (open), may require surgery to hold the fracture in place with screws, pins, and plates. After the bones are in proper alignment, the foot and ankle must be restrained for 6 or more weeks. Restraint allows healing to occur. After restraint, it is important to perform strengthening and stretching exercises to help regain strength and a full range of motion. These exercises may be completed at home or with a therapist. A stiff-soled shoe and arch support (orthotic) may be required when first returning to sports. MEDICATION   If pain medicine is needed, nonsteroidal anti-inflammatory medicines (NSAIDS), or other minor pain relievers, are often advised.  Do not take pain medicine for 7 days before surgery.  Only take over-the-counter or prescription medicines for pain, fever, or discomfort as directed by your caregiver. COLD THERAPY  Cold treatment (icing) should be applied for 10 to 15 minutes every 2 to  3 hours for inflammations and pain, and immediately after activity that aggravates your symptoms. Use ice packs or ice massage. SEEK MEDICAL CARE  IF:  Pain, tenderness, or swelling gets worse, despite treatment.  You experience pain, numbness, or coldness in the foot.  Blue, gray, or dark color appears in the toenails.  You or your child has an oral temperature above 102 F (38.9 C).  You have increased pain, swelling, and redness.  You have drainage of fluids or bleeding in the affected area.  New, unexplained symptoms develop. (Drugs used in treatment may produce side effects.) EXERCISES RANGE OF MOTION (ROM) AND STRETCHING EXERCISES - Metatarsal Fracture (including Jones and Dancer's Fractures) These exercises may help you when beginning to rehabilitate your injury. Your symptoms may resolve with or without further involvement from your physician, physical therapist, or athletic trainer. While completing these exercises, remember:   Restoring tissue flexibility helps normal motion to return to the joints. This allows healthier, less painful movement and activity.  An effective stretch should be held for at least 30 seconds. A stretch should never be painful. You should only feel a gentle lengthening or release in the stretched. RANGE OF MOTION - Dorsi/Plantar Flexion  While sitting with your right / left knee straight, draw the top of your foot upwards, by flexing your ankle. Then reverse the motion, pointing your toes downward.  Hold each position for __________ seconds.  After completing your first set of exercises, repeat this exercise with your knee bent. Repeat __________ times. Complete this exercise __________ times per day.  RANGE OF MOTION - Ankle Alphabet  Imagine your right / left big toe is a pen.  Keeping your hip and knee still, write out the entire alphabet with your "pen." Make the letters as large as you can, without increasing any discomfort. Repeat __________ times. Complete this exercise __________ times per day.  STRETCH - Gastroc, Standing  Place your hands on a wall.  Extend your right / left  leg behind you, keeping the front knee somewhat bent.  Slightly point your toes inward on your back foot.  Keeping your right / left heel on the floor and your knee straight, shift your weight toward the wall, not allowing your back to arch.  You should feel a gentle stretch in the right / left calf. Hold this position for __________ seconds. Repeat __________ times. Complete this stretch __________ times per day. STRETCH - Soleus, Standing   Place your hands on a wall.  Extend your right / left leg behind you, keeping the other knee somewhat bent.  Slightly point your toes inward on your back foot.  Keep your right / left heel on the floor, bend your back knee, and slightly shift your weight over the back leg so that you feel a gentle stretch deep in your back calf.  Hold this position for __________ seconds. Repeat __________ times. Complete this stretch __________ times per day. STRENGTHENING EXERCISES - Metatarsal Fracture (Including Jones and Dancer's Fractures) These exercises may help you when beginning to rehabilitate your injury. They may resolve your symptoms with or without further involvement from your physician, physical therapist, or athletic trainer. While completing these exercises, remember:   Muscles can gain both the endurance and the strength needed for everyday activities through controlled exercises.  Complete these exercises as instructed by your physician, physical therapist or athletic trainer. Increase the resistance and repetitions only as guided by your caregiver. STRENGTH - Dorsiflexors  Secure a rubber exercise  band or tubing to a fixed object (table, pole) and loop the other end around your right / left foot.  Sit on the floor facing the fixed object. The band should be slightly tense when your foot is relaxed.  Slowly draw your foot back toward you, using your ankle and toes.  Hold this position for __________ seconds. Slowly release the tension in  the band and return your foot to the starting position. Repeat __________ times. Complete this exercise __________ times per day.  STRENGTH - Plantar-flexors   Sit with your right / left leg extended. Holding onto both ends of a rubber exercise band or tubing, loop it around the ball of your foot. Keep a slight tension in the band.  Slowly push your toes away from you, pointing them downward.  Hold this position for __________ seconds. Return slowly, controlling the tension in the band. Repeat __________ times. Complete this exercise __________ times per day.  STRENGTH - Plantar-flexors  Stand with your feet shoulder width apart. Steady yourself with a wall or table, using as little support as needed.  Keeping your weight evenly spread over the width of your feet, rise up on your toes.*  Hold this position for __________ seconds. Repeat __________ times. Complete this exercise __________ times per day.  *If this is too easy, shift your weight toward your right / left leg until you feel challenged. Ultimately, you may be asked to do this exercise while standing on your right / left foot only. STRENGTH - Towel Curls  Sit in a chair, on a non-carpeted surface.  Place your foot on a towel, keeping your heel on the floor.  Pull the towel toward your heel only by curling your toes. Keep your heel on the floor.  If instructed by your physician, physical therapist, or athletic trainer, weight may be added at the end of the towel. Repeat __________ times. Complete this exercise __________ times per day. STRENGTH - Ankle Eversion  Secure one end of a rubber exercise band or tubing to a fixed object (table, pole). Loop the other end around your foot, just before your toes.  Place your fists between your knees. This will focus your strengthening at your ankle.  Drawing the band across your opposite foot, away from the pole, slowly, pull your little toe out and up. Make sure the band is  positioned to resist the entire motion.  Hold this position for __________ seconds.  Have your muscles resist the band, as it slowly pulls your foot back to the starting position. Repeat __________ times. Complete this exercise __________ times per day.  STRENGTH - Ankle Inversion  Secure one end of a rubber exercise band or tubing to a fixed object (table, pole). Loop the other end around your foot, just before your toes.  Place your fists between your knees. This will focus your strengthening at your ankle.  Slowly, pull your big toe up and in, making sure the band is positioned to resist the entire motion.  Hold this position for __________ seconds.  Have your muscles resist the band, as it slowly pulls your foot back to the starting position. Repeat __________ times. Complete this exercises __________ times per day.  Document Released: 03/14/2005 Document Revised: 06/06/2011 Document Reviewed: 06/27/2013 [facility] Patient Information 2015 [place], [place]. This information is not intended to replace advice given to you by your health care provider. Make sure you discuss any questions you have with your health care provider.

## 2014-12-07 NOTE — ED Notes (Signed)
Patient here with complaint of right foot pain secondary to getting her foot tangled in clothing and "rolling her ankle". Patient reports feeling a "pop". Foot appears mildly swollen. Denies other injury or pain.

## 2014-12-07 NOTE — ED Provider Notes (Signed)
CSN: [id number]     Arrival date & time 12/07/14  2026 History  This chart was scribed for non-physician practitioner, Montine Circle, PA-C working with Quintella Reichert, MD by Hansel Feinstein, ED scribe. This patient was seen in room TR11C/TR11C and the patient's care was started at 9:50 PM    Chief Complaint  Patient presents with  . Foot Pain   The history is provided by the patient. No language interpreter was used.   HPI Comments: Julie Chen is a 59 y.o. female who presents to the Emergency Department with a chief complaint of moderate left foot pain onset today with associated swelling. Pt states that she rolled her foot while putting on a pair of pants and heard a pop. Pt denies numbness, any other injuries.   Past Medical History  Diagnosis Date  . Migraine   . Diabetes mellitus without complication   . Concussion   . Depression   . Anxiety   . Fatigue    Past Surgical History  Procedure Laterality Date  . Abdominal hysterectomy    . Cholecystectomy    . Trigger finger release    . Cesarean section      X2  . Foot surgery Bilateral    Family History  Problem Relation Age of Onset  . Clotting disorder Mother   . Alzheimer's disease Father   . Heart Problems Brother   . Stroke Mother   . Osteoporosis Mother   . Migraines Father    Social History  Substance Use Topics  . Smoking status: Current Every Day Smoker -- 1.00 packs/day    Types: Cigarettes  . Smokeless tobacco: Never Used  . Alcohol Use: No   OB History    No data available     Review of Systems  Musculoskeletal: Positive for joint swelling and arthralgias.  Neurological: Negative for numbness.   Allergies  Phenergan and Sulfa antibiotics  Home Medications   Prior to Admission medications   Medication Sig Start Date End Date Taking? Authorizing Provider  ALPRAZolam Duanne Moron) 0.25 MG tablet Take 0.25 mg by mouth at bedtime as needed for anxiety.    Historical Provider, MD  Calcium Acetate, Phos  Binder, (CALCIUM ACETATE PO) Take by mouth daily.    Historical Provider, MD  escitalopram (LEXAPRO) 20 MG tablet Take 20 mg by mouth daily.    Historical Provider, MD  Fish Oil OIL by Does not apply route daily.    Historical Provider, MD  folic acid (FOLVITE) 1 MG tablet Take 1 mg by mouth daily.    Historical Provider, MD  gabapentin (NEURONTIN) 300 MG capsule Take 300 mg by mouth 4 (four) times daily.    Historical Provider, MD  HYDROcodone-acetaminophen (NORCO/VICODIN) 5-325 MG per tablet Take 1 tablet by mouth every 6 (six) hours as needed for moderate pain.    Historical Provider, MD  hydrOXYzine (VISTARIL) 25 MG capsule Take 25 mg by mouth 3 (three) times daily as needed.    Historical Provider, MD  magnesium 30 MG tablet Take 250 mg by mouth 2 (two) times daily.    Historical Provider, MD  meclizine (ANTIVERT) 25 MG tablet Take 25 mg by mouth 3 (three) times daily as needed for dizziness.    Historical Provider, MD  meloxicam (MOBIC) 7.5 MG tablet Take 7.5 mg by mouth daily.    Historical Provider, MD  montelukast (SINGULAIR) 10 MG tablet Take 10 mg by mouth at bedtime.    Historical Provider, MD  rizatriptan (MAXALT-MLT) 5 MG  disintegrating tablet Take 1 tablet (5 mg total) by mouth as needed for migraine. May repeat in 2 hours if needed 10/01/13   Marcial Pacas, MD  rosuvastatin (CRESTOR) 40 MG tablet Take 40 mg by mouth daily.    Historical Provider, MD  vitamin A 7500 UNIT capsule Take 7,500 Units by mouth daily.    Historical Provider, MD   BP 117/71 mmHg  Pulse 97  Temp(Src) 97.8 F (36.6 C) (Oral)  Resp 16  Ht 5\' 8"  (1.727 m)  Wt 188 lb (85.276 kg)  BMI 28.59 kg/m2  SpO2 96% Physical Exam  Constitutional: She is oriented to person, place, and time. She appears well-developed and well-nourished.  HENT:  Head: Normocephalic and atraumatic.  Eyes: Conjunctivae and EOM are normal. Pupils are equal, round, and reactive to light.  Neck: Normal range of motion. Neck supple.   Cardiovascular: Normal rate and intact distal pulses.   Intact distal pulses with brisk capillary refill  Pulmonary/Chest: Effort normal. No respiratory distress.  Abdominal: She exhibits no distension.  Musculoskeletal: Normal range of motion.  Tenderness to palpation over dorsal aspect of right foot, range of motion and strength limited secondary to pain, no obvious bony abnormality or deformity  Neurological: She is alert and oriented to person, place, and time.  Sensation intact  Skin: Skin is warm and dry.  Skin is intact  Psychiatric: She has a normal mood and affect. Her behavior is normal.  Nursing note and vitals reviewed.  ED Course  Procedures (including critical care time) DIAGNOSTIC STUDIES: Oxygen Saturation is 96% on RA, adequate by my interpretation.    Dg Ankle Complete Right  12/07/2014   CLINICAL DATA:  Right foot and ankle pain after trip and fall 1 hour prior.  EXAM: RIGHT ANKLE - COMPLETE 3+ VIEW  COMPARISON:  None.  FINDINGS: No fracture or dislocation. The alignment and joint spaces are maintained. The ankle mortise is preserved. There is a moderate plantar calcaneal spur. No focal soft tissue abnormality.  IMPRESSION: No fracture or dislocation of the right ankle.   Electronically Signed   By: Jeb Levering M.D.   On: 12/07/2014 21:58   Dg Foot Complete Right  12/07/2014   CLINICAL DATA:  Right foot and ankle pain after trip and fall 1 hour prior.  EXAM: RIGHT FOOT COMPLETE - 3+ VIEW  COMPARISON:  None.  FINDINGS: There are acute oblique fractures of the third through fifth distal metatarsals without intra-articular extension. Minimal displacement of the fifth metatarsal fracture. No additional foot fracture. 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 Moderate plantar calcaneal spur. There is lateral soft tissue edema.  IMPRESSION: Oblique fractures of the distal third through fifth metatarsals without intra-articular extension.   Electronically  Signed   By: Jeb Levering M.D.   On: 12/07/2014 21:57    I have personally reviewed and evaluated these images and lab results as part of my medical decision-making.  MDM   Final diagnoses:  Multiple closed fractures of metatarsal bone, right, initial encounter    Patient with oblique fractures of the third through fifth distal metatarsals, no severe angulation or deformity, will place patient in a postop shoe, given crutches, and recommend follow-up with orthopedics. Patient understands and agrees the plan. She is stable and ready for discharge.   I personally performed the services described in this documentation, which was scribed in my presence. The recorded information has been reviewed and is accurate.    Montine Circle, PA-C 12/07/14 2212  Quintella Reichert, MD 12/08/14 [phone]

## 2015-12-06 ENCOUNTER — Encounter (HOSPITAL_BASED_OUTPATIENT_CLINIC_OR_DEPARTMENT_OTHER): Payer: Self-pay | Admitting: Emergency Medicine

## 2015-12-06 ENCOUNTER — Emergency Department (HOSPITAL_BASED_OUTPATIENT_CLINIC_OR_DEPARTMENT_OTHER)
Admission: EM | Admit: 2015-12-06 | Discharge: 2015-12-06 | Disposition: A | Discharge status: Home / Self Care | Payer: BLUE CROSS/BLUE SHIELD | Attending: Emergency Medicine | Admitting: Emergency Medicine

## 2015-12-06 DIAGNOSIS — F17210 Nicotine dependence, cigarettes, uncomplicated: Secondary | ICD-10-CM | POA: Insufficient documentation

## 2015-12-06 DIAGNOSIS — Z79899 Other long term (current) drug therapy: Secondary | ICD-10-CM | POA: Insufficient documentation

## 2015-12-06 DIAGNOSIS — E119 Type 2 diabetes mellitus without complications: Secondary | ICD-10-CM | POA: Insufficient documentation

## 2015-12-06 DIAGNOSIS — R05 Cough: Secondary | ICD-10-CM | POA: Diagnosis present

## 2015-12-06 DIAGNOSIS — J0101 Acute recurrent maxillary sinusitis: Secondary | ICD-10-CM | POA: Insufficient documentation

## 2015-12-06 MED ORDER — LEVOFLOXACIN 500 MG PO TABS: 500.0000 mg | ORAL_TABLET | Freq: Every day | ORAL | 0 refills | Status: AC

## 2015-12-06 MED ORDER — FLUCONAZOLE 150 MG PO TABS: 150.0000 mg | ORAL_TABLET | Freq: Every day | ORAL | 1 refills | Status: AC

## 2015-12-06 NOTE — ED Provider Notes (Signed)
[name] Note   CSN: [id number] Arrival date & time: 12/06/15  1137     History   Chief Complaint Chief Complaint  Patient presents with  . Facial Pain    sore throat, congestion, dental pain    HPI Julie Chen is a 60 y.o. female.  The history is provided by the patient. No language interpreter was used.  Cough  This is a new problem. The current episode started more than 1 week ago. The problem occurs constantly. The problem has been gradually worsening. The cough is productive of sputum. There has been no fever. Associated symptoms include ear congestion and ear pain. She has tried nothing for the symptoms. The treatment provided moderate relief. She is not a smoker. Her past medical history does not include COPD.  Pt complains of sinus congestion and sinus pressure.  Pt treated with antibiotics 2 weeks ago.  Pt had chronic sinus and ear problems.  Past Medical History:  Diagnosis Date  . Anxiety   . Concussion   . Depression   . Diabetes mellitus without complication ([place])   . Fatigue   . Migraine     Patient Active Problem List   Diagnosis Date Noted  . Syncope 10/01/2013  . Migraine   . Concussion   . Depression   . Anxiety   . Fatigue     Past Surgical History:  Procedure Laterality Date  . ABDOMINAL HYSTERECTOMY    . CESAREAN SECTION     X2  . CHOLECYSTECTOMY    . FOOT SURGERY Bilateral   . TRIGGER FINGER RELEASE      OB History    No data available       Home Medications    Prior to Admission medications   Medication Sig Start Date End Date Taking? Authorizing Provider  ALPRAZolam Duanne Moron) 0.25 MG tablet Take 0.25 mg by mouth at bedtime as needed for anxiety.   Yes Historical Provider, MD  escitalopram (LEXAPRO) 20 MG tablet Take 20 mg by mouth daily.   Yes Historical Provider, MD  Fish Oil OIL by Does not apply route daily.   Yes Historical Provider, MD  folic acid (FOLVITE) 1 MG tablet Take 1 mg by mouth daily.    Yes Historical Provider, MD  gabapentin (NEURONTIN) 300 MG capsule Take 300 mg by mouth 4 (four) times daily.   Yes Historical Provider, MD  hydrOXYzine (VISTARIL) 25 MG capsule Take 25 mg by mouth 3 (three) times daily as needed.   Yes Historical Provider, MD  Calcium Acetate, Phos Binder, (CALCIUM ACETATE PO) Take by mouth daily.    Historical Provider, MD  fluconazole (DIFLUCAN) 150 MG tablet Take 1 tablet (150 mg total) by mouth daily. 12/06/15   Fransico Meadow, PA-C  HYDROcodone-acetaminophen (NORCO/VICODIN) 5-325 MG per tablet Take 1-2 tablets by mouth every 6 (six) hours as needed. 12/07/14   Montine Circle, PA-C  levofloxacin (LEVAQUIN) 500 MG tablet Take 1 tablet (500 mg total) by mouth daily. 12/06/15   Fransico Meadow, PA-C  magnesium 30 MG tablet Take 250 mg by mouth 2 (two) times daily.    Historical Provider, MD  meclizine (ANTIVERT) 25 MG tablet Take 25 mg by mouth 3 (three) times daily as needed for dizziness.    Historical Provider, MD  meloxicam (MOBIC) 7.5 MG tablet Take 7.5 mg by mouth daily.    Historical Provider, MD  montelukast (SINGULAIR) 10 MG tablet Take 10 mg by mouth at bedtime.  Historical Provider, MD  rizatriptan (MAXALT-MLT) 5 MG disintegrating tablet Take 1 tablet (5 mg total) by mouth as needed for migraine. May repeat in 2 hours if needed 10/01/13   Marcial Pacas, MD  rosuvastatin (CRESTOR) 40 MG tablet Take 40 mg by mouth daily.    Historical Provider, MD  vitamin A 7500 UNIT capsule Take 7,500 Units by mouth daily.    Historical Provider, MD    Family History Family History  Problem Relation Age of Onset  . Clotting disorder Mother   . Stroke Mother   . Osteoporosis Mother   . Alzheimer's disease Father   . Migraines Father   . Heart Problems Brother     Social History Social History  Substance Use Topics  . Smoking status: Current Every Day Smoker    Packs/day: 1.00    Years: 37.00    Types: Cigarettes  . Smokeless tobacco: Never Used  . Alcohol use  No     Allergies   Chantix [varenicline]; Phenergan [promethazine hcl]; and Sulfa antibiotics   Review of Systems Review of Systems  HENT: Positive for ear pain.   Respiratory: Positive for cough.   All other systems reviewed and are negative.    Physical Exam Updated Vital Signs BP 120/73 (BP Location: Right Arm)   Pulse 65   Temp 98.8 F (37.1 C) (Oral)   Resp 16   Ht 5\' 9"  (1.753 m)   Wt 89.8 kg   SpO2 99%   BMI 29.24 kg/m   Physical Exam  Constitutional: She appears well-developed and well-nourished. No distress.  HENT:  Head: Normocephalic and atraumatic.  Right Ear: External ear normal.  Left Ear: External ear normal.  Nose: Nose normal.  Mouth/Throat: Oropharynx is clear and moist.  Tender maxillary sinuses  Eyes: Conjunctivae are normal.  Neck: Neck supple.  Cardiovascular: Normal rate and regular rhythm.   No murmur heard. Pulmonary/Chest: Effort normal and breath sounds normal. No respiratory distress.  Abdominal: Soft. There is no tenderness.  Musculoskeletal: She exhibits no edema.  Neurological: She is alert.  Skin: Skin is warm and dry.  Psychiatric: She has a normal mood and affect.  Nursing note and vitals reviewed.    ED Treatments / Results  Labs (all labs ordered are listed, but only abnormal results are displayed) Labs Reviewed - No data to display  EKG  EKG Interpretation None       Radiology No results found.  Procedures Procedures (including critical care time)  Medications Ordered in ED Medications - No data to display   Initial Impression / Assessment and Plan / ED Course  I have reviewed the triage vital signs and the nursing notes.  Pertinent labs & imaging results that were available during my care of the patient were reviewed by me and considered in my medical decision making (see chart for details).  Clinical Course      Final Clinical Impressions(s) / ED Diagnoses   Final diagnoses:  Acute recurrent  maxillary sinusitis    New Prescriptions Discharge Medication List as of 12/06/2015  1:30 PM    START taking these medications   Details  levofloxacin (LEVAQUIN) 500 MG tablet Take 1 tablet (500 mg total) by mouth daily., Starting Sun 12/06/2015, [place], PA-C 12/06/15 [place], PA-C 12/06/15 1640    Davonna Belling, MD 12/08/15 [phone]

## 2015-12-06 NOTE — ED Triage Notes (Signed)
Patient with non productive cough, sore throat, congestion, and dental pain. Patient states she takes Zyrtec daily, and recently completed Amoxicillin (@ 2 weeks ago) for the same complaint.

## 2016-06-23 ENCOUNTER — Encounter (HOSPITAL_BASED_OUTPATIENT_CLINIC_OR_DEPARTMENT_OTHER): Payer: Self-pay

## 2016-06-23 ENCOUNTER — Emergency Department (HOSPITAL_BASED_OUTPATIENT_CLINIC_OR_DEPARTMENT_OTHER): Payer: BLUE CROSS/BLUE SHIELD

## 2016-06-23 ENCOUNTER — Emergency Department (HOSPITAL_BASED_OUTPATIENT_CLINIC_OR_DEPARTMENT_OTHER)
Admission: EM | Admit: 2016-06-23 | Discharge: 2016-06-24 | Disposition: A | Discharge status: Home / Self Care | Payer: BLUE CROSS/BLUE SHIELD | Attending: Emergency Medicine | Admitting: Emergency Medicine

## 2016-06-23 DIAGNOSIS — R42 Dizziness and giddiness: Secondary | ICD-10-CM | POA: Insufficient documentation

## 2016-06-23 DIAGNOSIS — Z7984 Long term (current) use of oral hypoglycemic drugs: Secondary | ICD-10-CM | POA: Insufficient documentation

## 2016-06-23 DIAGNOSIS — F17210 Nicotine dependence, cigarettes, uncomplicated: Secondary | ICD-10-CM | POA: Diagnosis not present

## 2016-06-23 DIAGNOSIS — E876 Hypokalemia: Secondary | ICD-10-CM | POA: Insufficient documentation

## 2016-06-23 DIAGNOSIS — E119 Type 2 diabetes mellitus without complications: Secondary | ICD-10-CM | POA: Insufficient documentation

## 2016-06-23 HISTORY — DX: Dizziness and giddiness: R42

## 2016-06-23 LAB — CBC WITH DIFFERENTIAL/PLATELET
BASOS PCT: 1 %
Basophils Absolute: 0 10*3/uL (ref 0.0–0.1)
EOS ABS: 0.1 10*3/uL (ref 0.0–0.7)
EOS PCT: 1 %
HCT: 40.2 % (ref 36.0–46.0)
HEMOGLOBIN: 14 g/dL (ref 12.0–15.0)
Lymphocytes Relative: 33 %
Lymphs Abs: 2.4 10*3/uL (ref 0.7–4.0)
MCH: 30.3 pg (ref 26.0–34.0)
MCHC: 34.8 g/dL (ref 30.0–36.0)
MCV: 87 fL (ref 78.0–100.0)
Monocytes Absolute: 0.6 10*3/uL (ref 0.1–1.0)
Monocytes Relative: 9 %
NEUTROS PCT: 56 %
Neutro Abs: 4.1 10*3/uL (ref 1.7–7.7)
PLATELETS: 331 10*3/uL (ref 150–400)
RBC: 4.62 MIL/uL (ref 3.87–5.11)
RDW: 15.8 % — ABNORMAL HIGH (ref 11.5–15.5)
WBC: 7.2 10*3/uL (ref 4.0–10.5)

## 2016-06-23 LAB — BASIC METABOLIC PANEL
Anion gap: 11 (ref 5–15)
BUN: 8 mg/dL (ref 6–20)
CALCIUM: 9.5 mg/dL (ref 8.9–10.3)
CO2: 21 mmol/L — ABNORMAL LOW (ref 22–32)
CREATININE: 0.77 mg/dL (ref 0.44–1.00)
Chloride: 103 mmol/L (ref 101–111)
GFR calc non Af Amer: >60 mL/min (ref >60)
Glucose, Bld: 172 mg/dL — ABNORMAL HIGH (ref 65–99)
Potassium: 2.9 mmol/L — ABNORMAL LOW (ref 3.5–5.1)
SODIUM: 135 mmol/L (ref 135–145)

## 2016-06-23 MED ORDER — POTASSIUM CHLORIDE CRYS ER 20 MEQ PO TBCR: 20.0000 meq | EXTENDED_RELEASE_TABLET | Freq: Every day | ORAL | 0 refills | Status: AC

## 2016-06-23 MED ORDER — POTASSIUM CHLORIDE CRYS ER 20 MEQ PO TBCR
40.0000 meq | EXTENDED_RELEASE_TABLET | Freq: Once | ORAL | Status: AC
  Administered 2016-06-23: 40 meq via ORAL
  Filled 2016-06-23: qty 2

## 2016-06-23 MED ORDER — POTASSIUM CHLORIDE 10 MEQ/100ML IV SOLN
10.0000 meq | Freq: Once | INTRAVENOUS | Status: AC
  Administered 2016-06-23: 10 meq via INTRAVENOUS
  Filled 2016-06-23: qty 100

## 2016-06-23 MED ORDER — FLUTICASONE PROPIONATE 50 MCG/ACT NA SUSP: 1.0000 | Freq: Every day | NASAL | 0 refills | Status: AC

## 2016-06-23 MED ORDER — DIAZEPAM 5 MG/ML IJ SOLN
5.0000 mg | Freq: Once | INTRAMUSCULAR | Status: AC
  Administered 2016-06-23: 5 mg via INTRAVENOUS
  Filled 2016-06-23: qty 2

## 2016-06-23 MED ORDER — SODIUM CHLORIDE 0.9 % IV BOLUS (SEPSIS)
1000.0000 mL | Freq: Once | INTRAVENOUS | Status: AC
  Administered 2016-06-23: 1000 mL via INTRAVENOUS

## 2016-06-23 MED ORDER — POTASSIUM CHLORIDE CRYS ER 20 MEQ PO TBCR: 40.0000 meq | EXTENDED_RELEASE_TABLET | Freq: Once | ORAL | Status: DC

## 2016-06-23 NOTE — ED Provider Notes (Signed)
Patient signed out to me to follow-up on head CT. Patient was seen for examination of lightheadedness and dizziness. She does also have a history of vertigo, symptoms tonight were more mild and different. She does not have any neurologic deficit. Head CT was normal except for sphenoid opacification, possible sinusitis. Patient also found to be hypokalemic. Etiology is unclear, but she has been using MiraLAX for constipation and has been having a lot of diarrhea. She was counseled to back off on the MiraLAX, follow-up with her doctor for possible GI referral. She will be placed on potassium supplementation, recheck by PCP in 1-2 weeks. Flonase also prescribed. Patient feeling improvement here in the ER with administration of Valium and potassium.  Vitals:   06/23/16 2108  BP: 113/71  Pulse: 70  Resp: 18  Temp: 97.9 F (36.6 C)      Orpah Greek, MD 06/23/16 2342

## 2016-06-23 NOTE — ED Provider Notes (Signed)
[facility] DE[facility] Provider Note   CSN: [id number] Arrival date & time: 06/23/16  2052   By signing my name below, I, Soijett Blue, attest that this documentation has been prepared under the direction and in the presence of Veryl Speak, MD. Electronically Signed: Falman, ED Scribe. 06/23/16. 9:56 PM.  History   Chief Complaint Chief Complaint  Patient presents with  . Dizziness    HPI Julie Chen is a 61 y.o. female with a PMHx of anxiety, vertigo, DM, who presents to the Emergency Department complaining of intermittent dizziness (room spinning) onset 2 weeks worsening today. Pt reports associated lightheadedness, brief LOC PTA, blurred vision, and abdominal pain. Family notes that the pt passed out while ambulating into the ED tonight. Pt has tried meclizine with no relief of her symptoms. She notes that her dizziness is worsened with moving her head and ambulating. She states that she was evaluated by her PCP today and was informed to take her vertigo medications (Meclizine). She denies blood in stools and any other symptoms. Denies cardiac issues. Pt PCP is Lanier Prude, PA-C.  The history is provided by the patient, the spouse and a relative. No language interpreter was used.    Past Medical History:  Diagnosis Date  . Anxiety   . Concussion   . Depression   . Diabetes mellitus without complication ([place])   . Fatigue   . Migraine   . Vertigo     Patient Active Problem List   Diagnosis Date Noted  . Syncope 10/01/2013  . Migraine   . Concussion   . Depression   . Anxiety   . Fatigue     Past Surgical History:  Procedure Laterality Date  . ABDOMINAL HYSTERECTOMY    . CESAREAN SECTION     X2  . CHOLECYSTECTOMY    . FOOT SURGERY Bilateral   . TRIGGER FINGER RELEASE      OB History    No data available       Home Medications    Prior to Admission medications   Medication Sig Start Date End Date Taking? Authorizing Provider  ALPRAZolam Duanne Moron)  0.25 MG tablet Take 0.25 mg by mouth at bedtime as needed for anxiety.    Historical Provider, MD  Calcium Acetate, Phos Binder, (CALCIUM ACETATE PO) Take by mouth daily.    Historical Provider, MD  escitalopram (LEXAPRO) 20 MG tablet Take 20 mg by mouth daily.    Historical Provider, MD  Fish Oil OIL by Does not apply route daily.    Historical Provider, MD  fluconazole (DIFLUCAN) 150 MG tablet Take 1 tablet (150 mg total) by mouth daily. 12/06/15   Fransico Meadow, PA-C  folic acid (FOLVITE) 1 MG tablet Take 1 mg by mouth daily.    Historical Provider, MD  gabapentin (NEURONTIN) 300 MG capsule Take 300 mg by mouth 4 (four) times daily.    Historical Provider, MD  HYDROcodone-acetaminophen (NORCO/VICODIN) 5-325 MG per tablet Take 1-2 tablets by mouth every 6 (six) hours as needed. 12/07/14   Montine Circle, PA-C  hydrOXYzine (VISTARIL) 25 MG capsule Take 25 mg by mouth 3 (three) times daily as needed.    Historical Provider, MD  levofloxacin (LEVAQUIN) 500 MG tablet Take 1 tablet (500 mg total) by mouth daily. 12/06/15   Fransico Meadow, PA-C  magnesium 30 MG tablet Take 250 mg by mouth 2 (two) times daily.    Historical Provider, MD  meclizine (ANTIVERT) 25 MG tablet Take 25 mg by  mouth 3 (three) times daily as needed for dizziness.    Historical Provider, MD  meloxicam (MOBIC) 7.5 MG tablet Take 7.5 mg by mouth daily.    Historical Provider, MD  montelukast (SINGULAIR) 10 MG tablet Take 10 mg by mouth at bedtime.    Historical Provider, MD  rizatriptan (MAXALT-MLT) 5 MG disintegrating tablet Take 1 tablet (5 mg total) by mouth as needed for migraine. May repeat in 2 hours if needed 10/01/13   Marcial Pacas, MD  rosuvastatin (CRESTOR) 40 MG tablet Take 40 mg by mouth daily.    Historical Provider, MD  vitamin A 7500 UNIT capsule Take 7,500 Units by mouth daily.    Historical Provider, MD    Family History Family History  Problem Relation Age of Onset  . Clotting disorder Mother   . Stroke Mother   .  Osteoporosis Mother   . Alzheimer's disease Father   . Migraines Father   . Heart Problems Brother     Social History Social History  Substance Use Topics  . Smoking status: Current Every Day Smoker    Packs/day: 1.00    Years: 37.00    Types: Cigarettes  . Smokeless tobacco: Never Used  . Alcohol use No     Allergies   Chantix [varenicline]; Metformin and related; Phenergan [promethazine hcl]; and Sulfa antibiotics   Review of Systems Review of Systems  All other systems reviewed and are negative.    Physical Exam Updated Vital Signs BP 113/71 (BP Location: Left Arm)   Pulse 70   Temp 97.9 F (36.6 C) (Oral)   Resp 18   Ht 5\' 7"  (1.702 m)   Wt 192 lb (87.1 kg)   SpO2 100%   BMI 30.07 kg/m   Physical Exam  Constitutional: She is oriented to person, place, and time. She appears well-developed and well-nourished. No distress.  HENT:  Head: Normocephalic and atraumatic.  Right Ear: Hearing normal.  Left Ear: Hearing normal.  Nose: Nose normal.  Mouth/Throat: Oropharynx is clear and moist and mucous membranes are normal.  Eyes: Conjunctivae and EOM are normal. Pupils are equal, round, and reactive to light.  Neck: Normal range of motion. Neck supple.  Cardiovascular: Regular rhythm, S1 normal and S2 normal.  Exam reveals no gallop and no friction rub.   No murmur heard. Pulmonary/Chest: Effort normal and breath sounds normal. No respiratory distress. She exhibits no tenderness.  Abdominal: Soft. Normal appearance and bowel sounds are normal. There is no hepatosplenomegaly. There is no tenderness. There is no rebound, no guarding, no tenderness at McBurney's point and negative Murphy's sign. No hernia.  Musculoskeletal: Normal range of motion.  Neurological: She is alert and oriented to person, place, and time. She has normal strength. No cranial nerve deficit. Coordination normal.  Symptoms exacerbated with eye movements and change in position.  Skin: Skin is  warm, dry and intact. No rash noted. No cyanosis.  Psychiatric: She has a normal mood and affect. Her speech is normal and behavior is normal. Thought content normal.  Nursing note and vitals reviewed.    ED Treatments / Results  DIAGNOSTIC STUDIES: Oxygen Saturation is 100% on RA, nl by my interpretation.    COORDINATION OF CARE: 9:53 PM Discussed treatment plan with pt at bedside which includes EKG, valium, IV fluids, labs, CT head, and pt agreed to plan.   Labs (all labs ordered are listed, but only abnormal results are displayed) Labs Reviewed  BASIC METABOLIC PANEL - Abnormal; Notable for the  following:       Result Value   Potassium 2.9 (*)    CO2 21 (*)    Glucose, Bld 172 (*)    All other components within normal limits  CBC WITH DIFFERENTIAL/PLATELET - Abnormal; Notable for the following:    RDW 15.8 (*)    All other components within normal limits    EKG  EKG Interpretation  Date/Time:  Thursday June 23 2016 21:01:47 EDT Ventricular Rate:  67 PR Interval:    QRS Duration: 92 QT Interval:  402 QTC Calculation: 424 R Axis:   16 Text Interpretation:  Sinus rhythm Artifact Otherwise normal ECG Confirmed by Saniyyah Elster  MD, Leldon Steege ([id number]) on 06/23/2016 9:56:31 PM       Radiology Ct Head Wo Contrast  Result Date: 06/23/2016 CLINICAL DATA:  Subacute onset of dizziness. Near syncope. Initial encounter. EXAM: CT HEAD WITHOUT CONTRAST TECHNIQUE: Contiguous axial images were obtained from the base of the skull through the vertex without intravenous contrast. COMPARISON:  CT of the head performed 09/15/2015, and MRI of the brain performed 12/08/2015 FINDINGS: Brain: No evidence of acute infarction, hemorrhage, hydrocephalus, extra-axial collection or mass lesion/mass effect. The posterior fossa, including the cerebellum, brainstem and fourth ventricle, is within normal limits. The third and lateral ventricles, and basal ganglia are unremarkable in appearance. The cerebral  hemispheres are symmetric in appearance, with normal gray-white differentiation. No mass effect or midline shift is seen. Vascular: No hyperdense vessel or unexpected calcification. Skull: There is no evidence of fracture; visualized osseous structures are unremarkable in appearance. Sinuses/Orbits: The orbits are within normal limits. There is mild partial opacification of the sphenoid sinus. The remaining paranasal sinuses and mastoid air cells are well-aerated. Other: No significant soft tissue abnormalities are seen. IMPRESSION: 1. No acute intracranial pathology seen on CT. 2. Mild partial opacification of the sphenoid sinus. Electronically Signed   By: Garald Balding M.D.   On: 06/23/2016 23:15    Procedures Procedures (including critical care time)  Medications Ordered in ED Medications  potassium chloride 10 mEq in 100 mL IVPB (10 mEq Intravenous New Bag/Given 06/23/16 2313)  sodium chloride 0.9 % bolus 1,000 mL (0 mLs Intravenous Stopped 06/23/16 2312)  diazepam (VALIUM) injection 5 mg (5 mg Intravenous Given 06/23/16 2218)  potassium chloride SA (K-DUR,KLOR-CON) CR tablet 40 mEq (40 mEq Oral Given 06/23/16 2311)     Initial Impression / Assessment and Plan / ED Course  I have reviewed the triage vital signs and the nursing notes.  Pertinent labs & imaging results that were available during my care of the patient were reviewed by me and considered in my medical decision making (see chart for details).  Patient presents with complaints of dizziness. Her physical examination is essentially unremarkable and laboratory studies and head CT revealed only mild hypokalemia with a potassium of 2.9. This will be replaced. She was given medications and fluids in the ER. We are awaiting response to these medications. She will be reevaluated by Dr. Betsey Holiday and disposition will be made at that time.  Final Clinical Impressions(s) / ED Diagnoses   Final diagnoses:  None    New Prescriptions New  Prescriptions   No medications on file   I personally performed the services described in this documentation, which was scribed in my presence. The recorded information has been reviewed and is accurate.        Veryl Speak, MD 06/30/16 [phone]

## 2016-06-23 NOTE — ED Triage Notes (Signed)
Pt with dizziness x 2 weeks-was seen by PCP for same c/o and for chronic neck pain-last seen today-pt also c/o SOB today-sister states she took xanax x 2 for anxiety and meclizine x 2 for dizziness-sister and husband state that pt "passed out" while walking in from parking lot-they placed her on the bench and she was brought in by w/c-pt is A/O-NAD

## 2016-09-01 ENCOUNTER — Other Ambulatory Visit: Payer: Self-pay | Admitting: Sports Medicine

## 2016-09-01 DIAGNOSIS — M5412 Radiculopathy, cervical region: Secondary | ICD-10-CM

## 2016-09-08 ENCOUNTER — Ambulatory Visit
Admission: RE | Admit: 2016-09-08 | Discharge: 2016-09-08 | Disposition: A | Discharge status: Home / Self Care | Payer: BLUE CROSS/BLUE SHIELD | Source: Ambulatory Visit | Attending: Sports Medicine | Admitting: Sports Medicine

## 2016-09-08 DIAGNOSIS — M5412 Radiculopathy, cervical region: Secondary | ICD-10-CM

## 2017-09-06 ENCOUNTER — Other Ambulatory Visit: Payer: Self-pay | Admitting: Sports Medicine

## 2017-09-06 DIAGNOSIS — M2392 Unspecified internal derangement of left knee: Secondary | ICD-10-CM

## 2017-09-08 ENCOUNTER — Other Ambulatory Visit: Payer: Self-pay | Admitting: Sports Medicine

## 2017-09-08 DIAGNOSIS — M5416 Radiculopathy, lumbar region: Secondary | ICD-10-CM

## 2017-09-10 ENCOUNTER — Ambulatory Visit
Admission: RE | Admit: 2017-09-10 | Discharge: 2017-09-10 | Disposition: A | Discharge status: Home / Self Care | Payer: BLUE CROSS/BLUE SHIELD | Source: Ambulatory Visit | Attending: Sports Medicine | Admitting: Sports Medicine

## 2017-09-10 DIAGNOSIS — M2392 Unspecified internal derangement of left knee: Secondary | ICD-10-CM

## 2017-09-10 DIAGNOSIS — M5416 Radiculopathy, lumbar region: Secondary | ICD-10-CM

## 2020-10-20 ENCOUNTER — Other Ambulatory Visit: Payer: Self-pay

## 2020-10-20 ENCOUNTER — Emergency Department (HOSPITAL_BASED_OUTPATIENT_CLINIC_OR_DEPARTMENT_OTHER): Payer: BLUE CROSS/BLUE SHIELD

## 2020-10-20 ENCOUNTER — Emergency Department (HOSPITAL_BASED_OUTPATIENT_CLINIC_OR_DEPARTMENT_OTHER)
Admission: EM | Admit: 2020-10-20 | Discharge: 2020-10-20 | Disposition: A | Discharge status: Home / Self Care | Payer: BLUE CROSS/BLUE SHIELD | Attending: Emergency Medicine | Admitting: Emergency Medicine

## 2020-10-20 ENCOUNTER — Encounter (HOSPITAL_BASED_OUTPATIENT_CLINIC_OR_DEPARTMENT_OTHER): Payer: Self-pay

## 2020-10-20 DIAGNOSIS — R110 Nausea: Secondary | ICD-10-CM | POA: Insufficient documentation

## 2020-10-20 DIAGNOSIS — Z91040 Latex allergy status: Secondary | ICD-10-CM | POA: Diagnosis not present

## 2020-10-20 DIAGNOSIS — R5383 Other fatigue: Secondary | ICD-10-CM | POA: Diagnosis not present

## 2020-10-20 DIAGNOSIS — E876 Hypokalemia: Secondary | ICD-10-CM | POA: Insufficient documentation

## 2020-10-20 DIAGNOSIS — M25551 Pain in right hip: Secondary | ICD-10-CM | POA: Insufficient documentation

## 2020-10-20 DIAGNOSIS — R531 Weakness: Secondary | ICD-10-CM | POA: Diagnosis present

## 2020-10-20 DIAGNOSIS — F17210 Nicotine dependence, cigarettes, uncomplicated: Secondary | ICD-10-CM | POA: Insufficient documentation

## 2020-10-20 DIAGNOSIS — R079 Chest pain, unspecified: Secondary | ICD-10-CM

## 2020-10-20 DIAGNOSIS — E119 Type 2 diabetes mellitus without complications: Secondary | ICD-10-CM | POA: Insufficient documentation

## 2020-10-20 DIAGNOSIS — Z20822 Contact with and (suspected) exposure to covid-19: Secondary | ICD-10-CM | POA: Diagnosis not present

## 2020-10-20 DIAGNOSIS — M25552 Pain in left hip: Secondary | ICD-10-CM | POA: Diagnosis not present

## 2020-10-20 DIAGNOSIS — Z859 Personal history of malignant neoplasm, unspecified: Secondary | ICD-10-CM | POA: Diagnosis not present

## 2020-10-20 DIAGNOSIS — R059 Cough, unspecified: Secondary | ICD-10-CM | POA: Insufficient documentation

## 2020-10-20 DIAGNOSIS — M25569 Pain in unspecified knee: Secondary | ICD-10-CM

## 2020-10-20 HISTORY — DX: Malignant (primary) neoplasm, unspecified: C80.1

## 2020-10-20 LAB — RESP PANEL BY RT-PCR (FLU A&B, COVID) ARPGX2
Influenza A by PCR: NEGATIVE
Influenza B by PCR: NEGATIVE
SARS Coronavirus 2 by RT PCR: NEGATIVE

## 2020-10-20 LAB — CK: Total CK: 27 U/L — ABNORMAL LOW (ref 38–234)

## 2020-10-20 LAB — CBG MONITORING, ED: Glucose-Capillary: 374 mg/dL — ABNORMAL HIGH (ref 70–99)

## 2020-10-20 LAB — CBC WITH DIFFERENTIAL/PLATELET
Abs Immature Granulocytes: 1.09 10*3/uL — ABNORMAL HIGH (ref 0.00–0.07)
Basophils Absolute: 0.1 10*3/uL (ref 0.0–0.1)
Basophils Relative: 2 %
Eosinophils Absolute: 0 10*3/uL (ref 0.0–0.5)
Eosinophils Relative: 0 %
HCT: 36.9 % (ref 36.0–46.0)
Hemoglobin: 13 g/dL (ref 12.0–15.0)
Immature Granulocytes: 14 %
Lymphocytes Relative: 13 %
Lymphs Abs: 1 10*3/uL (ref 0.7–4.0)
MCH: 33.9 pg (ref 26.0–34.0)
MCHC: 35.2 g/dL (ref 30.0–36.0)
MCV: 96.3 fL (ref 80.0–100.0)
Monocytes Absolute: 0.1 10*3/uL (ref 0.1–1.0)
Monocytes Relative: 1 %
Neutro Abs: 5.6 10*3/uL (ref 1.7–7.7)
Neutrophils Relative %: 70 %
Platelets: 188 10*3/uL (ref 150–400)
RBC: 3.83 MIL/uL — ABNORMAL LOW (ref 3.87–5.11)
RDW: 16.7 % — ABNORMAL HIGH (ref 11.5–15.5)
WBC: 7.9 10*3/uL (ref 4.0–10.5)
nRBC: 0 % (ref 0.0–0.2)

## 2020-10-20 LAB — COMPREHENSIVE METABOLIC PANEL
ALT: 26 U/L (ref 0–44)
AST: 25 U/L (ref 15–41)
Albumin: 3.4 g/dL — ABNORMAL LOW (ref 3.5–5.0)
Alkaline Phosphatase: 101 U/L (ref 38–126)
Anion gap: 16 — ABNORMAL HIGH (ref 5–15)
BUN: 12 mg/dL (ref 8–23)
CO2: 26 mmol/L (ref 22–32)
Calcium: 9.4 mg/dL (ref 8.9–10.3)
Chloride: 92 mmol/L — ABNORMAL LOW (ref 98–111)
Creatinine, Ser: 0.6 mg/dL (ref 0.44–1.00)
GFR, Estimated: >60 mL/min (ref >60)
Glucose, Bld: 314 mg/dL — ABNORMAL HIGH (ref 70–99)
Potassium: 3 mmol/L — ABNORMAL LOW (ref 3.5–5.1)
Sodium: 134 mmol/L — ABNORMAL LOW (ref 135–145)
Total Bilirubin: 1.7 mg/dL — ABNORMAL HIGH (ref 0.3–1.2)
Total Protein: 6 g/dL — ABNORMAL LOW (ref 6.5–8.1)

## 2020-10-20 LAB — PHOSPHORUS: Phosphorus: 1.9 mg/dL — ABNORMAL LOW (ref 2.5–4.6)

## 2020-10-20 LAB — MAGNESIUM: Magnesium: 1.6 mg/dL — ABNORMAL LOW (ref 1.7–2.4)

## 2020-10-20 MED ORDER — MORPHINE SULFATE (PF) 4 MG/ML IV SOLN
4.0000 mg | Freq: Once | INTRAVENOUS | Status: AC
  Administered 2020-10-20: 4 mg via INTRAVENOUS
  Filled 2020-10-20: qty 1

## 2020-10-20 MED ORDER — INSULIN ASPART 100 UNIT/ML IJ SOLN
2.0000 [IU] | Freq: Once | INTRAMUSCULAR | Status: AC
  Administered 2020-10-20: 2 [IU] via SUBCUTANEOUS

## 2020-10-20 MED ORDER — MAGNESIUM SULFATE 2 GM/50ML IV SOLN
2.0000 g | Freq: Once | INTRAVENOUS | Status: AC
  Administered 2020-10-20: 2 g via INTRAVENOUS
  Filled 2020-10-20: qty 50

## 2020-10-20 MED ORDER — OXYCODONE-ACETAMINOPHEN 5-325 MG PO TABS
1.0000 | ORAL_TABLET | Freq: Once | ORAL | Status: AC
  Administered 2020-10-20: 1 via ORAL
  Filled 2020-10-20: qty 1

## 2020-10-20 MED ORDER — ONDANSETRON HCL 4 MG/2ML IJ SOLN
4.0000 mg | Freq: Once | INTRAMUSCULAR | Status: AC
  Administered 2020-10-20: 4 mg via INTRAVENOUS
  Filled 2020-10-20: qty 2

## 2020-10-20 MED ORDER — POTASSIUM CHLORIDE 10 MEQ/100ML IV SOLN
10.0000 meq | Freq: Once | INTRAVENOUS | Status: AC
  Administered 2020-10-20: 10 meq via INTRAVENOUS
  Filled 2020-10-20: qty 100

## 2020-10-20 MED ORDER — POTASSIUM CHLORIDE ER 20 MEQ PO TBCR: 40.0000 meq | EXTENDED_RELEASE_TABLET | Freq: Every day | ORAL | 0 refills | Status: AC

## 2020-10-20 MED ORDER — SODIUM CHLORIDE 0.9 % IV BOLUS
1000.0000 mL | Freq: Once | INTRAVENOUS | Status: AC
  Administered 2020-10-20: 1000 mL via INTRAVENOUS

## 2020-10-20 MED ORDER — OXYCODONE-ACETAMINOPHEN 5-325 MG PO TABS
1.0000 | ORAL_TABLET | Freq: Four times a day (QID) | ORAL | 0 refills | Status: AC | PRN
Start: 2020-10-20 — End: ?

## 2020-10-20 MED ORDER — OXYCODONE-ACETAMINOPHEN 5-325 MG PO TABS
1.0000 | ORAL_TABLET | Freq: Once | ORAL | Status: AC
Start: 2020-10-20 — End: 2020-10-20
  Administered 2020-10-20: 1 via ORAL
  Filled 2020-10-20: qty 1

## 2020-10-20 NOTE — ED Notes (Signed)
Pt is sitting in the car with her family until a room is available for tx due to hx of cancer and immunocompromised.

## 2020-10-20 NOTE — Discharge Instructions (Signed)
As we discussed, your lab work today was reassuring.  Your x-rays did not show any acute bony abnormality.  As we discussed, your potassium was low.  Sometimes this can cause weakness.  Please take potassium pills as directed.  Please follow-up with your oncologist.  We have provided you with additional pain medication to help with your pain.  Do not take this at the same time as Vicodin.  Return to emergency department for any worsening pain, chest pain, difficulty breathing or any other worsening concerning symptoms.

## 2020-10-20 NOTE — ED Triage Notes (Signed)
Pt arrives with c/o weakness, states that she has been having her legs give out on her and had pain from hips down. Pt also reports passing out in car PTA. Triage delayed r/t patient wishing to sit in car until a room was open in ED d/t having cancer (right breast). Pt also reports having a cough for the past 4 weeks.

## 2020-10-20 NOTE — ED Provider Notes (Signed)
I provided a substantive portion of the care of this patient.  I personally performed the entirety of the history for this encounter.  EKG Interpretation  Date/Time:  Tuesday October 20 2020 12:03:17 EDT Ventricular Rate:  91 PR Interval:  144 QRS Duration: 86 QT Interval:  374 QTC Calculation: 461 R Axis:   36 Text Interpretation: Sinus rhythm no acute  ischemic appearance no sig change from previous Confirmed by Charlesetta Shanks [phone]) on 10/22/2020 7:37:51 AM   Patient presents with increasing general weakness of her lower extremities.  Also she is experiencing a rather deep generalized pain.  She describes it like "growing pains in the bones".  She is currently receiving chemotherapy.  Patient is alert clear mental status.  Lower extremities warm and dry with good peripheral pulses.  No significant edema.  No focal findings on exam.  Patient feels improved after hydration.  Mild electrolyte derangement.  Agree with recommendations for electrolyte replacement and close follow-up with oncology.  At this time no sign of DVT or arterial obstruction, no sign of cellulitis, history and physical exam is not highly suggestive of spinal cord impingement, patient is denying any back pain.  At this time stable for discharge with close follow-up with oncology and further diagnostic evaluation as indicated.   Charlesetta Shanks, MD 10/22/20 [phone]

## 2020-10-20 NOTE — ED Provider Notes (Signed)
[facility]ER[facility] DEPART[facility] Provider Note   CSN: [id number] Arrival date & time: 10/20/20  1120     History Chief Complaint  Patient presents with   Weakness    Julie Chen is a 65 y.o. female with PMH/o DM, fatigue, cancer (actively receiving chemo) who presents for evaluation of generalized weakness, fatigue, cough, pain in her hips and legs that has been ongoing for about 4 weeks.  She states cough is not productive.  Does not have any associated difficulty breathing.  Has not noted any fevers, chills, chest pain.  She also reports that over the last 4 weeks, she has had pain into her hips and her upper legs.  She states that they feel weak and feels like they will not support her and that they give out.  She told her oncologist about this and he thought it was more musculoskeletal and did not think it was result of the chemo.  She has felt weak and tired and occasionally have some nausea but is not vomiting.  He has not had any abdominal pain.  She is currently getting chemo and states her last session was 3 days ago.   The history is provided by the patient.      Past Medical History:  Diagnosis Date   Anxiety    Cancer ([place])    Concussion    Depression    Diabetes mellitus without complication ([place])    Fatigue    Migraine    Vertigo     Patient Active Problem List   Diagnosis Date Noted   Syncope 10/01/2013   Migraine    Concussion    Depression    Anxiety    Fatigue     Past Surgical History:  Procedure Laterality Date   ABDOMINAL HYSTERECTOMY     CESAREAN SECTION     X2   CHOLECYSTECTOMY     FOOT SURGERY Bilateral    TRIGGER FINGER RELEASE       OB History   No obstetric history on file.     Family History  Problem Relation Age of Onset   Clotting disorder Mother    Stroke Mother    Osteoporosis Mother    Alzheimer's disease Father    Migraines Father    Heart Problems Brother     Social History   Tobacco Use   Smoking  status: Every Day    Packs/day: 1.00    Years: 37.00    Pack years: 37.00    Types: Cigarettes   Smokeless tobacco: Never  Vaping Use   Vaping Use: Never used  Substance Use Topics   Alcohol use: No   Drug use: No    Home Medications Prior to Admission medications   Medication Sig Start Date End Date Taking? Authorizing Provider  oxyCODONE-acetaminophen (PERCOCET/ROXICET) 5-325 MG tablet Take 1 tablet by mouth every 6 (six) hours as needed for severe pain. 10/20/20  Yes Providence Lanius A, PA-C  potassium chloride 20 MEQ TBCR Take 40 mEq by mouth daily for 4 days. 10/20/20 10/24/20 Yes Volanda Napoleon, PA-C  ALPRAZolam Duanne Moron) 0.25 MG tablet Take 0.25 mg by mouth at bedtime as needed for anxiety.    [provider]  Calcium Acetate, Phos Binder, (CALCIUM ACETATE PO) Take by mouth daily.    [provider]  escitalopram (LEXAPRO) 20 MG tablet Take 20 mg by mouth daily.    [provider]  Fish Oil OIL by Does not apply route daily.  [provider]  fluconazole (DIFLUCAN) 150 MG tablet Take 1 tablet (150 mg total) by mouth daily. 12/06/15   Fransico Meadow, PA-C  fluticasone (FLONASE) 50 MCG/ACT nasal spray Place 1 spray into both nostrils daily. 06/23/16   Orpah Greek, MD  folic acid (FOLVITE) 1 MG tablet Take 1 mg by mouth daily.    [provider]  gabapentin (NEURONTIN) 300 MG capsule Take 300 mg by mouth 4 (four) times daily.    [provider]  HYDROcodone-acetaminophen (NORCO/VICODIN) 5-325 MG per tablet Take 1-2 tablets by mouth every 6 (six) hours as needed. 12/07/14   Montine Circle, PA-C  hydrOXYzine (VISTARIL) 25 MG capsule Take 25 mg by mouth 3 (three) times daily as needed.    [provider]  levofloxacin (LEVAQUIN) 500 MG tablet Take 1 tablet (500 mg total) by mouth daily. 12/06/15   Fransico Meadow, PA-C  magnesium 30 MG tablet Take 250 mg by mouth 2 (two) times daily.    [provider]   meclizine (ANTIVERT) 25 MG tablet Take 25 mg by mouth 3 (three) times daily as needed for dizziness.    [provider]  meloxicam (MOBIC) 7.5 MG tablet Take 7.5 mg by mouth daily.    [provider]  montelukast (SINGULAIR) 10 MG tablet Take 10 mg by mouth at bedtime.    [provider]  potassium chloride SA (K-DUR,KLOR-CON) 20 MEQ tablet Take 1 tablet (20 mEq total) by mouth daily. 06/23/16   Orpah Greek, MD  rizatriptan (MAXALT-MLT) 5 MG disintegrating tablet Take 1 tablet (5 mg total) by mouth as needed for migraine. May repeat in 2 hours if needed 10/01/13   Marcial Pacas, MD  rosuvastatin (CRESTOR) 40 MG tablet Take 40 mg by mouth daily.    [provider]  vitamin A 7500 UNIT capsule Take 7,500 Units by mouth daily.    [provider]    Allergies    Chantix [varenicline], Metformin and related, Phenergan [promethazine hcl], Sulfa antibiotics, Aspirin, Canagliflozin, Latex, Levofloxacin, and Sulfasalazine  Review of Systems   Review of Systems  Constitutional:  Negative for fever.  Respiratory:  Positive for cough. Negative for shortness of breath.   Cardiovascular:  Negative for chest pain.  Gastrointestinal:  Negative for abdominal pain, nausea and vomiting.  Genitourinary:  Negative for dysuria and hematuria.  Musculoskeletal:  Positive for myalgias.  Neurological:  Positive for weakness. Negative for headaches.  All other systems reviewed and are negative.  Physical Exam Updated Vital Signs BP (!) 105/58   Pulse 82   Temp 98.8 F (37.1 C) (Oral)   Resp 15   Ht '5\' 7"'$  (1.702 m)   Wt 88 kg   SpO2 100%   BMI 30.38 kg/m   Physical Exam Vitals and nursing note reviewed.  Constitutional:      Appearance: Normal appearance. She is well-developed.  HENT:     Head: Normocephalic and atraumatic.  Eyes:     General: Lids are normal.     Conjunctiva/sclera: Conjunctivae normal.     Pupils: Pupils are equal, round, and  reactive to light.  Cardiovascular:     Rate and Rhythm: Normal rate and regular rhythm.     Pulses: Normal pulses.          Radial pulses are 2+ on the right side and 2+ on the left side.       Dorsalis pedis pulses are 2+ on the right side and 2+ on the  left side.     Heart sounds: Normal heart sounds. No murmur heard.   No friction rub. No gallop.  Pulmonary:     Effort: Pulmonary effort is normal.     Breath sounds: Normal breath sounds.     Comments: Lungs clear to auscultation bilaterally.  Symmetric chest rise.  No wheezing, rales, rhonchi. Able to speak in full sentences without any difficulty breathing.  Abdominal:     Palpations: Abdomen is soft. Abdomen is not rigid.     Tenderness: There is no abdominal tenderness. There is no guarding.     Comments: Abdomen is soft, non-distended, non-tender. No rigidity, No guarding. No peritoneal signs.  Musculoskeletal:        General: Normal range of motion.     Cervical back: Full passive range of motion without pain.     Comments: No bony tenderness of the bilateral hips, bilateral upper legs.  No deformity or crepitus noted.  No overlying warmth, erythema, edema.  Skin:    General: Skin is warm and dry.     Capillary Refill: Capillary refill takes less than 2 seconds.  Neurological:     Mental Status: She is alert and oriented to person, place, and time.     Comments: Symmetric strength in bilateral upper and lower extremities.  Psychiatric:        Speech: Speech normal.    ED Results / Procedures / Treatments   Labs (all labs ordered are listed, but only abnormal results are displayed) Labs Reviewed  COMPREHENSIVE METABOLIC PANEL - Abnormal; Notable for the following components:      Result Value   Sodium 134 (*)    Potassium 3.0 (*)    Chloride 92 (*)    Glucose, Bld 314 (*)    Total Protein 6.0 (*)    Albumin 3.4 (*)    Total Bilirubin 1.7 (*)    Anion gap 16 (*)    All other components within normal limits  CBC  WITH DIFFERENTIAL/PLATELET - Abnormal; Notable for the following components:   RBC 3.83 (*)    RDW 16.7 (*)    Abs Immature Granulocytes 1.09 (*)    All other components within normal limits  CK - Abnormal; Notable for the following components:   Total CK 27 (*)    All other components within normal limits  MAGNESIUM - Abnormal; Notable for the following components:   Magnesium 1.6 (*)    All other components within normal limits  PHOSPHORUS - Abnormal; Notable for the following components:   Phosphorus 1.9 (*)    All other components within normal limits  CBG MONITORING, ED - Abnormal; Notable for the following components:   Glucose-Capillary 374 (*)    All other components within normal limits  RESP PANEL BY RT-PCR (FLU A&B, COVID) [id number]  PATHOLOGIST SMEAR REVIEW    EKG None  Radiology DG Pelvis 1-2 Views  Result Date: 10/20/2020 CLINICAL DATA:  Bilateral hip pain, no injury, history of breast cancer EXAM: PELVIS - 1-2 VIEW COMPARISON:  None. FINDINGS: There is no evidence of pelvic fracture or diastasis. No pelvic bone lesions are seen. Hip joint spaces are preserved in single frontal view. Nonobstructive pattern of overlying bowel gas. IMPRESSION: No displaced fracture or dislocation of the pelvis or bilateral proximal femurs in single frontal view. No suspicious osseous lesions appreciated per reported history of breast cancer. Electronically Signed   By: Eddie Candle M.D.   On: 10/20/2020 14:09   DG Chest [facility]  1 View  Result Date: 10/20/2020 CLINICAL DATA:  Weakness, syncope EXAM: PORTABLE CHEST 1 VIEW COMPARISON:  None. FINDINGS: The heart size and mediastinal contours are within normal limits. Left chest port catheter. Both lungs are clear. The visualized skeletal structures are unremarkable. IMPRESSION: No acute abnormality of the lungs in AP portable projection. Electronically Signed   By: Eddie Candle M.D.   On: 10/20/2020 14:01   DG Knee Complete 4 Views Left  Result  Date: 10/20/2020 CLINICAL DATA:  Bilateral hip and knee pain for few days, no injury EXAM: LEFT KNEE - COMPLETE 4+ VIEW; RIGHT KNEE - COMPLETE 4+ VIEW COMPARISON:  None. FINDINGS: Osteopenia. No evidence of fracture, dislocation, or joint effusion. No evidence of arthropathy or other focal bone abnormality. Soft tissues are unremarkable. IMPRESSION: 1.  Osteopenia. No fracture or dislocation of the bilateral knees. 2.  Joint spaces are preserved. 3.  No knee joint effusion. Electronically Signed   By: Eddie Candle M.D.   On: 10/20/2020 14:07   DG Knee Complete 4 Views Right  Result Date: 10/20/2020 CLINICAL DATA:  Bilateral hip and knee pain for few days, no injury EXAM: LEFT KNEE - COMPLETE 4+ VIEW; RIGHT KNEE - COMPLETE 4+ VIEW COMPARISON:  None. FINDINGS: Osteopenia. No evidence of fracture, dislocation, or joint effusion. No evidence of arthropathy or other focal bone abnormality. Soft tissues are unremarkable. IMPRESSION: 1.  Osteopenia. No fracture or dislocation of the bilateral knees. 2.  Joint spaces are preserved. 3.  No knee joint effusion. Electronically Signed   By: Eddie Candle M.D.   On: 10/20/2020 14:07    Procedures Procedures   Medications Ordered in ED Medications  sodium chloride 0.9 % bolus 1,000 mL (0 mLs Intravenous Stopped 10/20/20 1658)  insulin aspart (novoLOG) injection 2 Units (2 Units Subcutaneous Given 10/20/20 1441)  ondansetron (ZOFRAN) injection 4 mg (4 mg Intravenous Given 10/20/20 1428)  morphine 4 MG/ML injection 4 mg (4 mg Intravenous Given 10/20/20 1431)  potassium chloride 10 mEq in 100 mL IVPB (0 mEq Intravenous Stopped 10/20/20 1725)  oxyCODONE-acetaminophen (PERCOCET/ROXICET) 5-325 MG per tablet 1 tablet (1 tablet Oral Given 10/20/20 1541)  magnesium sulfate IVPB 2 g 50 mL (0 g Intravenous Stopped 10/20/20 1811)  oxyCODONE-acetaminophen (PERCOCET/ROXICET) 5-325 MG per tablet 1 tablet (1 tablet Oral Given 10/20/20 1815)    ED Course  I have reviewed the triage  vital signs and the nursing notes.  Pertinent labs & imaging results that were available during my care of the patient were reviewed by me and considered in my medical decision making (see chart for details).    MDM Rules/Calculators/A&P                           65 year old female who presents for evaluation of generalized weakness, fatigue, cough, pain in bilateral lower extremities.  History of breast cancer and is currently actively getting chemo.  Reports her last 4 weeks, she has had symptoms.  She reports that she feels like her legs are weak and give out and also hurt.  She has told her oncologist but they do not have an answer as to what is causing her symptoms.  She states that she feels weak and tired.  On initial arrival, she is afebrile nontoxic-appearing.  Vital signs are stable.  On exam, she has equal pulses in all 4 extremities.  Bilateral lower extremities are symmetric in appearance without any overlying warmth, erythema.  Sister did report  that as they were bring her into the ED on a wheelchair, patient did have an episode of passing out.  Patient states that she frequently passes out.  During my evaluation, patient became very tired and closed her eyes.  She never lost pulses and was responsive to verbal stimuli during this time.  She would follow my commands during this and never lost consciousness.  We will plan to check labs, x-rays.  CBG is 374. CBC with no leukocytosis or anemia. CMP shows sodium of 134, potassium of 3.0, BUN and Cr are within normal. Anion gap is 16. COVID is negative. CK is 27.   Mag and phos are low. Will replete.   CXR is negative for any infectious etiology.  Knee x-ray bilaterally showed no evidence of acute bony abnormality. Pelvis XR shows no displaced fracture or dislocation of the pelvis or proximal femurs. No osseous lesions.   Reevaluation.  Patient reports feeling better after fluids, analgesics.  She states she feels better.  She has not had  any more syncopal episodes here in the ED.  She does have pain medication as prescribed by her doctor.  States hydrocodone.  She did have some improvement with the Percocet here in the ED.  We will plan to send her home with short course of Percocet as well as potassium pills for her hypokalemia.  Patient instructed to follow-up with her oncologist. At this time, patient exhibits no emergent life-threatening condition that require further evaluation in ED. Patient had ample opportunity for questions and discussion. All patient's questions were answered with full understanding. Strict return precautions discussed. Patient expresses understanding and agreement to plan.   Portions of this note were generated with [employer]. Dictation errors may occur despite best attempts at proofreading.   Final Clinical Impression(s) / ED Diagnoses Final diagnoses:  Generalized weakness  Hypokalemia    Rx / DC Orders ED Discharge Orders          Ordered    oxyCODONE-acetaminophen (PERCOCET/ROXICET) 5-325 MG tablet  Every 6 hours PRN        10/20/20 1811    potassium chloride 20 MEQ TBCR  Daily        10/20/20 1811             Desma Mcgregor 10/20/20 1847    Charlesetta Shanks, MD 10/22/20 [phone]

## 2020-10-20 NOTE — ED Notes (Signed)
Gave pt. Food and drink.  Pt. Tolerated well.  No distress noted in Pt.

## 2020-10-20 NOTE — ED Notes (Signed)
ED Provider at bedside. 

## 2020-10-22 LAB — PATHOLOGIST SMEAR REVIEW

## 2021-03-14 ENCOUNTER — Other Ambulatory Visit: Payer: Self-pay

## 2021-03-14 ENCOUNTER — Emergency Department (HOSPITAL_BASED_OUTPATIENT_CLINIC_OR_DEPARTMENT_OTHER)
Admission: EM | Admit: 2021-03-14 | Discharge: 2021-03-14 | Disposition: A | Discharge status: Home / Self Care | Payer: BLUE CROSS/BLUE SHIELD | Attending: Emergency Medicine | Admitting: Emergency Medicine

## 2021-03-14 ENCOUNTER — Encounter (HOSPITAL_BASED_OUTPATIENT_CLINIC_OR_DEPARTMENT_OTHER): Payer: Self-pay

## 2021-03-14 ENCOUNTER — Emergency Department (HOSPITAL_BASED_OUTPATIENT_CLINIC_OR_DEPARTMENT_OTHER): Payer: BLUE CROSS/BLUE SHIELD

## 2021-03-14 DIAGNOSIS — F17210 Nicotine dependence, cigarettes, uncomplicated: Secondary | ICD-10-CM | POA: Diagnosis not present

## 2021-03-14 DIAGNOSIS — W1839XA Other fall on same level, initial encounter: Secondary | ICD-10-CM | POA: Insufficient documentation

## 2021-03-14 DIAGNOSIS — Z85828 Personal history of other malignant neoplasm of skin: Secondary | ICD-10-CM | POA: Insufficient documentation

## 2021-03-14 DIAGNOSIS — Z91040 Latex allergy status: Secondary | ICD-10-CM | POA: Diagnosis not present

## 2021-03-14 DIAGNOSIS — S82831A Other fracture of upper and lower end of right fibula, initial encounter for closed fracture: Secondary | ICD-10-CM | POA: Insufficient documentation

## 2021-03-14 DIAGNOSIS — E119 Type 2 diabetes mellitus without complications: Secondary | ICD-10-CM | POA: Diagnosis not present

## 2021-03-14 DIAGNOSIS — S99911A Unspecified injury of right ankle, initial encounter: Secondary | ICD-10-CM | POA: Diagnosis present

## 2021-03-14 NOTE — ED Notes (Signed)
Portable Xray at bedside.

## 2021-03-14 NOTE — ED Triage Notes (Signed)
Pt states was walking to restroom and states she "thinks she fell asleep standing up" and fell. States her right leg gave out from her. Remembers the fall, denies hitting head. C/o RLE pain. Receiving treatment for cancer.

## 2021-03-14 NOTE — ED Provider Notes (Signed)
[facility] EMERGENCY DEPARTMENT Provider Note   CSN: [id number] Arrival date & time: 03/14/21  1823     History Chief Complaint  Patient presents with   Julie Chen    Julie Chen is a 65 y.o. female presenting to the ED after a fall 2 hours ago. She is able to recollect events well, and is accompanied by husband who confirms information. She reports that she woke up and headed to the bathroom for a shower but fell on her way. She reports that she believes that she was not fully awake at that time, resulting in her fall. She landed on her right foot. She complains of right ankle pain that radiates up to the shin; no pain above the knees. Reports trouble with weight bearing on right side. She denies head trauma. She denies sensory changes, confusion, headache, CP, or SHOB. She reports improvement in pain since arriving to the ED and joint being immobilized.   Fall Pertinent negatives include no chest pain, no headaches and no shortness of breath.      Past Medical History:  Diagnosis Date   Anxiety    Cancer ([place])    Concussion    Depression    Diabetes mellitus without complication ([place])    Fatigue    Migraine    Vertigo     Patient Active Problem List   Diagnosis Date Noted   Syncope 10/01/2013   Migraine    Concussion    Depression    Anxiety    Fatigue     Past Surgical History:  Procedure Laterality Date   ABDOMINAL HYSTERECTOMY     CESAREAN SECTION     X2   CHOLECYSTECTOMY     FOOT SURGERY Bilateral    TRIGGER FINGER RELEASE       OB History   No obstetric history on file.     Family History  Problem Relation Age of Onset   Clotting disorder Mother    Stroke Mother    Osteoporosis Mother    Alzheimer's disease Father    Migraines Father    Heart Problems Brother     Social History   Tobacco Use   Smoking status: Every Day    Packs/day: 1.00    Years: 37.00    Pack years: 37.00    Types: Cigarettes   Smokeless tobacco: Never   Vaping Use   Vaping Use: Never used  Substance Use Topics   Alcohol use: No   Drug use: No    Home Medications Prior to Admission medications   Medication Sig Start Date End Date Taking? Authorizing Provider  ALPRAZolam Duanne Moron) 0.25 MG tablet Take 0.25 mg by mouth at bedtime as needed for anxiety.    [provider]  Calcium Acetate, Phos Binder, (CALCIUM ACETATE PO) Take by mouth daily.    [provider]  escitalopram (LEXAPRO) 20 MG tablet Take 20 mg by mouth daily.    [provider]  Fish Oil OIL by Does not apply route daily.    [provider]  fluconazole (DIFLUCAN) 150 MG tablet Take 1 tablet (150 mg total) by mouth daily. 12/06/15   Fransico Meadow, PA-C  fluticasone (FLONASE) 50 MCG/ACT nasal spray Place 1 spray into both nostrils daily. 06/23/16   Orpah Greek, MD  folic acid (FOLVITE) 1 MG tablet Take 1 mg by mouth daily.    [provider]  gabapentin (NEURONTIN) 300 MG capsule Take 300 mg by mouth 4 (four) times daily.  [provider]  HYDROcodone-acetaminophen (NORCO/VICODIN) 5-325 MG per tablet Take 1-2 tablets by mouth every 6 (six) hours as needed. 12/07/14   Montine Circle, PA-C  hydrOXYzine (VISTARIL) 25 MG capsule Take 25 mg by mouth 3 (three) times daily as needed.    [provider]  levofloxacin (LEVAQUIN) 500 MG tablet Take 1 tablet (500 mg total) by mouth daily. 12/06/15   Fransico Meadow, PA-C  magnesium 30 MG tablet Take 250 mg by mouth 2 (two) times daily.    [provider]  meclizine (ANTIVERT) 25 MG tablet Take 25 mg by mouth 3 (three) times daily as needed for dizziness.    [provider]  meloxicam (MOBIC) 7.5 MG tablet Take 7.5 mg by mouth daily.    [provider]  montelukast (SINGULAIR) 10 MG tablet Take 10 mg by mouth at bedtime.    [provider]  oxyCODONE-acetaminophen (PERCOCET/ROXICET) 5-325 MG tablet Take 1 tablet by mouth every 6  (six) hours as needed for severe pain. 10/20/20   Volanda Napoleon, PA-C  potassium chloride 20 MEQ TBCR Take 40 mEq by mouth daily for 4 days. 10/20/20 10/24/20  [place]idence Lanius A, PA-C  potassium chloride SA (K-DUR,KLOR-CON) 20 MEQ tablet Take 1 tablet (20 mEq total) by mouth daily. 06/23/16   Orpah Greek, MD  rizatriptan (MAXALT-MLT) 5 MG disintegrating tablet Take 1 tablet (5 mg total) by mouth as needed for migraine. May repeat in 2 hours if needed 10/01/13   Marcial Pacas, MD  rosuvastatin (CRESTOR) 40 MG tablet Take 40 mg by mouth daily.    [provider]  vitamin A 7500 UNIT capsule Take 7,500 Units by mouth daily.    [provider]    Allergies    Chantix [varenicline], Metformin and related, Phenergan [promethazine hcl], Sulfa antibiotics, Aspirin, Canagliflozin, Latex, Levofloxacin, and Sulfasalazine  Review of Systems   Review of Systems  Constitutional: Negative.   HENT: Negative.    Eyes: Negative.   Respiratory:  Negative for chest tightness, shortness of breath and wheezing.   Cardiovascular:  Negative for chest pain, palpitations and leg swelling.  Gastrointestinal: Negative.   Genitourinary: Negative.   Musculoskeletal:  Positive for gait problem. Negative for joint swelling.  Neurological:  Negative for tremors, facial asymmetry, speech difficulty, weakness, light-headedness, numbness and headaches.  Psychiatric/Behavioral: Negative.     Physical Exam Updated Vital Signs BP (!) 119/59 (BP Location: Left Arm)    Pulse 78    Temp 97.7 F (36.5 C) (Oral)    Resp 18    Ht 5\' 7"  (1.702 m)    Wt 82.1 kg    SpO2 94%    BMI 28.35 kg/m   Physical Exam  ED Results / Procedures / Treatments   Labs (all labs ordered are listed, but only abnormal results are displayed) Labs Reviewed - No data to display  EKG None  Radiology No results found.  Procedures Procedures   Medications Ordered in ED Medications - No data to display  ED Course  I  have reviewed the triage vital signs and the nursing notes.  Pertinent labs & imaging results that were available during my care of the patient were reviewed by me and considered in my medical decision making (see chart for details).    MDM Rules/Calculators/A&P                         Right ankle pain radiating up to mid shin  in s/o recent fall. Pain associated with bearing weight on affected side. She denies head trauma or pain on left side. Ice applied to affected area and right ankle immobilized. XR of R ankle and tibia/fibular obtained showing minimally displaced fracture of the distal fibular shaft.   She is counseled on appropriate management of distal fibular fracture including immobilization with splint and cam boot, rest, icing affect area, and elevation. She is instructed to follow up with orthopedics, information provided. She reports understanding. Pt already has a cam boot.  Rolling walker provided, paper script for DME scooter provided, and pt gave pt contact information for orthopedics.      Final Clinical Impression(s) / ED Diagnoses Final diagnoses:  None    Rx / DC Orders ED Discharge Orders     None        Lajean Manes, MD 03/14/21 2013    Blanchie Dessert, MD 03/14/21 2251

## 2021-05-13 ENCOUNTER — Emergency Department (HOSPITAL_BASED_OUTPATIENT_CLINIC_OR_DEPARTMENT_OTHER): Payer: BLUE CROSS/BLUE SHIELD

## 2021-05-13 ENCOUNTER — Emergency Department (HOSPITAL_BASED_OUTPATIENT_CLINIC_OR_DEPARTMENT_OTHER)
Admission: EM | Admit: 2021-05-13 | Discharge: 2021-05-13 | Disposition: A | Discharge status: Home / Self Care | Payer: BLUE CROSS/BLUE SHIELD | Attending: Emergency Medicine | Admitting: Emergency Medicine

## 2021-05-13 ENCOUNTER — Encounter (HOSPITAL_BASED_OUTPATIENT_CLINIC_OR_DEPARTMENT_OTHER): Payer: Self-pay

## 2021-05-13 ENCOUNTER — Other Ambulatory Visit: Payer: Self-pay

## 2021-05-13 DIAGNOSIS — E876 Hypokalemia: Secondary | ICD-10-CM | POA: Insufficient documentation

## 2021-05-13 DIAGNOSIS — W19XXXA Unspecified fall, initial encounter: Secondary | ICD-10-CM | POA: Diagnosis not present

## 2021-05-13 DIAGNOSIS — Z7951 Long term (current) use of inhaled steroids: Secondary | ICD-10-CM | POA: Diagnosis not present

## 2021-05-13 DIAGNOSIS — R531 Weakness: Secondary | ICD-10-CM | POA: Insufficient documentation

## 2021-05-13 DIAGNOSIS — Z91040 Latex allergy status: Secondary | ICD-10-CM | POA: Insufficient documentation

## 2021-05-13 DIAGNOSIS — Z20822 Contact with and (suspected) exposure to covid-19: Secondary | ICD-10-CM | POA: Insufficient documentation

## 2021-05-13 DIAGNOSIS — W228XXA Striking against or struck by other objects, initial encounter: Secondary | ICD-10-CM | POA: Diagnosis not present

## 2021-05-13 DIAGNOSIS — E860 Dehydration: Secondary | ICD-10-CM | POA: Insufficient documentation

## 2021-05-13 DIAGNOSIS — Z853 Personal history of malignant neoplasm of breast: Secondary | ICD-10-CM | POA: Insufficient documentation

## 2021-05-13 LAB — COMPREHENSIVE METABOLIC PANEL
ALT: 15 U/L (ref 0–44)
AST: 28 U/L (ref 15–41)
Albumin: 3 g/dL — ABNORMAL LOW (ref 3.5–5.0)
Alkaline Phosphatase: 90 U/L (ref 38–126)
Anion gap: 8 (ref 5–15)
BUN: 7 mg/dL — ABNORMAL LOW (ref 8–23)
CO2: 32 mmol/L (ref 22–32)
Calcium: 9 mg/dL (ref 8.9–10.3)
Chloride: 97 mmol/L — ABNORMAL LOW (ref 98–111)
Creatinine, Ser: 0.58 mg/dL (ref 0.44–1.00)
GFR, Estimated: >60 mL/min (ref >60)
Glucose, Bld: 161 mg/dL — ABNORMAL HIGH (ref 70–99)
Potassium: 3.2 mmol/L — ABNORMAL LOW (ref 3.5–5.1)
Sodium: 137 mmol/L (ref 135–145)
Total Bilirubin: 0.6 mg/dL (ref 0.3–1.2)
Total Protein: 6.9 g/dL (ref 6.5–8.1)

## 2021-05-13 LAB — URINALYSIS, ROUTINE W REFLEX MICROSCOPIC
Bilirubin Urine: NEGATIVE
Glucose, UA: NEGATIVE mg/dL
Hgb urine dipstick: NEGATIVE
Ketones, ur: NEGATIVE mg/dL
Leukocytes,Ua: NEGATIVE
Nitrite: NEGATIVE
Protein, ur: NEGATIVE mg/dL
Specific Gravity, Urine: 1.015 (ref 1.005–1.030)
pH: 6.5 (ref 5.0–8.0)

## 2021-05-13 LAB — CBC WITH DIFFERENTIAL/PLATELET
Abs Immature Granulocytes: 0.03 10*3/uL (ref 0.00–0.07)
Basophils Absolute: 0 10*3/uL (ref 0.0–0.1)
Basophils Relative: 1 %
Eosinophils Absolute: 0.1 10*3/uL (ref 0.0–0.5)
Eosinophils Relative: 1 %
HCT: 40.3 % (ref 36.0–46.0)
Hemoglobin: 13.7 g/dL (ref 12.0–15.0)
Immature Granulocytes: 1 %
Lymphocytes Relative: 17 %
Lymphs Abs: 1.1 10*3/uL (ref 0.7–4.0)
MCH: 31.9 pg (ref 26.0–34.0)
MCHC: 34 g/dL (ref 30.0–36.0)
MCV: 93.9 fL (ref 80.0–100.0)
Monocytes Absolute: 0.8 10*3/uL (ref 0.1–1.0)
Monocytes Relative: 12 %
Neutro Abs: 4.5 10*3/uL (ref 1.7–7.7)
Neutrophils Relative %: 68 %
Platelets: 192 10*3/uL (ref 150–400)
RBC: 4.29 MIL/uL (ref 3.87–5.11)
RDW: 15.5 % (ref 11.5–15.5)
WBC: 6.5 10*3/uL (ref 4.0–10.5)
nRBC: 0 % (ref 0.0–0.2)

## 2021-05-13 LAB — TROPONIN I (HIGH SENSITIVITY): Troponin I (High Sensitivity): 6 ng/L (ref <18)

## 2021-05-13 LAB — MAGNESIUM: Magnesium: 1.6 mg/dL — ABNORMAL LOW (ref 1.7–2.4)

## 2021-05-13 LAB — RESP PANEL BY RT-PCR (FLU A&B, COVID) ARPGX2
Influenza A by PCR: NEGATIVE
Influenza B by PCR: NEGATIVE
SARS Coronavirus 2 by RT PCR: NEGATIVE

## 2021-05-13 LAB — LACTIC ACID, PLASMA: Lactic Acid, Venous: 1.7 mmol/L (ref 0.5–1.9)

## 2021-05-13 LAB — CK: Total CK: 271 U/L — ABNORMAL HIGH (ref 38–234)

## 2021-05-13 MED ORDER — SODIUM CHLORIDE 0.9 % IV BOLUS
1000.0000 mL | Freq: Once | INTRAVENOUS | Status: AC
  Administered 2021-05-13: 1000 mL via INTRAVENOUS

## 2021-05-13 MED ORDER — MAGNESIUM SULFATE 2 GM/50ML IV SOLN
2.0000 g | Freq: Once | INTRAVENOUS | Status: AC
  Administered 2021-05-13: 2 g via INTRAVENOUS
  Filled 2021-05-13: qty 50

## 2021-05-13 MED ORDER — POTASSIUM CHLORIDE 10 MEQ/100ML IV SOLN
10.0000 meq | Freq: Once | INTRAVENOUS | Status: AC
  Administered 2021-05-13: 10 meq via INTRAVENOUS
  Filled 2021-05-13: qty 100

## 2021-05-13 NOTE — ED Provider Notes (Signed)
[facility] Provider Note   CSN: [id number] Arrival date & time: 05/13/21  1348     History  Chief Complaint  Patient presents with   Julie Chen    Alissandra Geoffroy is a 66 y.o. 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  Patient states that last week she has fallen 3 times.  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  She does report that the tremors have been getting worse over the past week.  She states that she was seen at [facility] on 2/11 after fall and diagnosed with a nondisplaced right great toe fracture.  She was discharged home at that time.  Husband reports when he brought her into the house she had worsening tremors and he could barely get her in.  She went to [facility] in [place] the next day for similar complaints and found to have a low potassium at 3.0.  She was repleted with fluids and potassium and has been reports she felt significantly better at that time and was discharged home.  He states that she had been doing well at home until last night when she began having worsening tremors again.  He states that last night she fell and hit her head however did not lose consciousness.  She is not anticoagulated.  She also landed directly onto her buttocks.  She has been complaining of pain to this area since that time.  Husband reports overnight she had increased fatigue and he had difficulty waking her up this morning she is atypical for her.  He states that she also has not been eating or drinking very much recently.  They attempted to consult their oncologist today who ultimately advised him to come to the ED for further evaluation.   The history is provided by the patient, the spouse and medical records.      Home  Medications Prior to Admission medications   Medication Sig Start Date End Date Taking? Authorizing Provider  ALPRAZolam Duanne Moron) 0.25 MG tablet Take 0.25 mg by mouth at bedtime as needed for anxiety.    [provider]  Calcium Acetate, Phos Binder, (CALCIUM ACETATE PO) Take by mouth daily.    [provider]  escitalopram (LEXAPRO) 20 MG tablet Take 20 mg by mouth daily.    [provider]  Fish Oil OIL by Does not apply route daily.    [provider]  fluconazole (DIFLUCAN) 150 MG tablet Take 1 tablet (150 mg total) by mouth daily. 12/06/15   Fransico Meadow, PA-C  fluticasone (FLONASE) 50 MCG/ACT nasal spray Place 1 spray into both nostrils daily. 06/23/16   Orpah Greek, MD  folic acid (FOLVITE) 1 MG tablet Take 1 mg by mouth daily.    [provider]  gabapentin (NEURONTIN) 300 MG capsule Take 300 mg by mouth 4 (four) times daily.    [provider]  HYDROcodone-acetaminophen (NORCO/VICODIN) 5-325 MG per tablet Take 1-2 tablets by mouth every 6 (six) hours as needed. 12/07/14   Montine Circle, PA-C  hydrOXYzine (VISTARIL) 25 MG capsule Take 25 mg by mouth 3 (three) times daily as needed.    [provider]  levofloxacin (LEVAQUIN) 500 MG tablet Take 1 tablet (500 mg total) by mouth daily. 12/06/15   Fransico Meadow,  PA-C  magnesium 30 MG tablet Take 250 mg by mouth 2 (two) times daily.    [provider]  meclizine (ANTIVERT) 25 MG tablet Take 25 mg by mouth 3 (three) times daily as needed for dizziness.    [provider]  meloxicam (MOBIC) 7.5 MG tablet Take 7.5 mg by mouth daily.    [provider]  montelukast (SINGULAIR) 10 MG tablet Take 10 mg by mouth at bedtime.    [provider]  oxyCODONE-acetaminophen (PERCOCET/ROXICET) 5-325 MG tablet Take 1 tablet by mouth every 6 (six) hours as needed for severe pain. 10/20/20   Volanda Napoleon, PA-C  potassium chloride 20 MEQ TBCR Take  40 mEq by mouth daily for 4 days. 10/20/20 10/24/20  Providence Lanius A, PA-C  potassium chloride SA (K-DUR,KLOR-CON) 20 MEQ tablet Take 1 tablet (20 mEq total) by mouth daily. 06/23/16   Orpah Greek, MD  rizatriptan (MAXALT-MLT) 5 MG disintegrating tablet Take 1 tablet (5 mg total) by mouth as needed for migraine. May repeat in 2 hours if needed 10/01/13   Marcial Pacas, MD  rosuvastatin (CRESTOR) 40 MG tablet Take 40 mg by mouth daily.    [provider]  vitamin A 7500 UNIT capsule Take 7,500 Units by mouth daily.    [provider]      Allergies    Chantix [varenicline], Metformin and related, Phenergan [promethazine hcl], Sulfa antibiotics, Aspirin, Canagliflozin, Latex, Levofloxacin, and Sulfasalazine    Review of Systems   Review of Systems  Constitutional:  Positive for appetite change and fatigue. Negative for fever.  Respiratory:  Negative for cough and shortness of breath.   Gastrointestinal:  Negative for abdominal pain, nausea and vomiting.  Musculoskeletal:  Positive for arthralgias.  Neurological:  Positive for tremors and headaches. Negative for syncope.  All other systems reviewed and are negative.  Physical Exam Updated Vital Signs BP 120/68    Pulse 79    Temp 98 F (36.7 C) (Oral)    Resp 16    Ht $R'5\' 8"'uy$  (1.727 m)    Wt 78.5 kg    SpO2 96%    BMI 26.30 kg/m  Physical Exam Vitals and nursing note reviewed.  Constitutional:      Appearance: She is not ill-appearing or diaphoretic.  HENT:     Head: Normocephalic.     Comments: Small hematoma noted to crown of head with TTP.  Eyes:     Extraocular Movements: Extraocular movements intact.     Conjunctiva/sclera: Conjunctivae normal.     Pupils: Pupils are equal, round, and reactive to light.  Cardiovascular:     Rate and Rhythm: Normal rate and regular rhythm.  Pulmonary:     Effort: Pulmonary effort is normal.     Breath sounds: Normal breath sounds. No wheezing, rhonchi or rales.      Comments: + mild left rib TTP. No crepitus.  Chest:     Chest wall: Tenderness present.  Abdominal:     Palpations: Abdomen is soft.     Tenderness: There is no abdominal tenderness. There is no guarding or rebound.  Musculoskeletal:     Cervical back: Neck supple.     Comments: + coccyx TTP. No obvious C, T, or L midline spinal TTP. Pelvis is stable. Moving all extremities without difficulty.   Skin:    General: Skin is warm and dry.  Neurological:     General: No focal deficit present.     Mental Status: She is  alert and oriented to person, place, and time.    ED Results / Procedures / Treatments   Labs (all labs ordered are listed, but only abnormal results are displayed) Labs Reviewed  COMPREHENSIVE METABOLIC PANEL - Abnormal; Notable for the following components:      Result Value   Potassium 3.2 (*)    Chloride 97 (*)    Glucose, Bld 161 (*)    BUN 7 (*)    Albumin 3.0 (*)    All other components within normal limits  MAGNESIUM - Abnormal; Notable for the following components:   Magnesium 1.6 (*)    All other components within normal limits  CK - Abnormal; Notable for the following components:   Total CK 271 (*)    All other components within normal limits  RESP PANEL BY RT-PCR (FLU A&B, COVID) ARPGX2  CBC WITH DIFFERENTIAL/PLATELET  URINALYSIS, ROUTINE W REFLEX MICROSCOPIC  LACTIC ACID, PLASMA  TROPONIN I (HIGH SENSITIVITY)    EKG None  Radiology DG Ribs Unilateral W/Chest Left  Result Date: 05/13/2021 CLINICAL DATA:  [place], left lateral posterior rib injury, history of right breast cancer EXAM: LEFT RIBS AND CHEST - 3+ VIEW COMPARISON:  10/20/2020 FINDINGS: Frontal view of the chest as well as frontal and oblique views of the left thoracic cage are obtained. Left chest wall port via internal jugular approach tip overlies superior vena cava. The cardiac silhouette is unremarkable. No airspace disease, effusion, or pneumothorax. Chronic interstitial scarring. There  are no acute displaced rib fractures. No destructive bony lesions. IMPRESSION: 1. No displaced rib fracture.  No acute intrathoracic process. Electronically Signed   By: Randa Ngo M.D.   On: 05/13/2021 16:40   DG Sacrum/Coccyx  Result Date: 05/13/2021 CLINICAL DATA:  Trauma and pain. EXAM: SACRUM AND COCCYX - 2+ VIEW COMPARISON:  10/20/2020 pelvic radiographs. FINDINGS: Sacroiliac joints are symmetric. No sacral or coccygeal fracture identified. Degenerate disc disease involves L5-S1 and L3-4. IMPRESSION: No acute osseous abnormality. Electronically Signed   By: Abigail Miyamoto M.D.   On: 05/13/2021 16:44   CT Head Wo Contrast  Result Date: 05/13/2021 CLINICAL DATA:  Fall yesterday. EXAM: CT HEAD WITHOUT CONTRAST CT CERVICAL SPINE WITHOUT CONTRAST TECHNIQUE: Multidetector CT imaging of the head and cervical spine was performed following the standard protocol without intravenous contrast. Multiplanar CT image reconstructions of the cervical spine were also generated. RADIATION DOSE REDUCTION: This exam was performed according to the departmental dose-optimization program which includes automated exposure control, adjustment of the mA and/or kV according to patient size and/or use of iterative reconstruction technique. COMPARISON:  June 22, 2016. FINDINGS: CT HEAD FINDINGS Brain: No evidence of acute infarction, hemorrhage, hydrocephalus, extra-axial collection or mass lesion/mass effect. Vascular: No hyperdense vessel or unexpected calcification. Skull: Normal. Negative for fracture or focal lesion. Sinuses/Orbits: No acute finding. Other: None. CT CERVICAL SPINE FINDINGS Alignment: Minimal grade 1 anterolisthesis of C4-5 is noted secondary to posterior facet joint hypertrophy. Skull base and vertebrae: No acute fracture. No primary bone lesion or focal pathologic process. Soft tissues and spinal canal: No prevertebral fluid or swelling. No visible canal hematoma. Disc levels: Severe degenerative disc  disease is noted at C6-7 with anterior posterior osteophyte formation. Upper chest: Negative. Other: None. IMPRESSION: No acute intracranial abnormality seen. Severe degenerative disc disease is noted at C6-7. No acute abnormality seen in the cervical spine. Electronically Signed   By: Marijo Conception M.D.   On: 05/13/2021 16:02   CT Cervical Spine Wo Contrast  Result Date: 05/13/2021 CLINICAL DATA:  Fall yesterday. EXAM: CT HEAD WITHOUT CONTRAST CT CERVICAL SPINE WITHOUT CONTRAST TECHNIQUE: Multidetector CT imaging of the head and cervical spine was performed following the standard protocol without intravenous contrast. Multiplanar CT image reconstructions of the cervical spine were also generated. RADIATION DOSE REDUCTION: This exam was performed according to the departmental dose-optimization program which includes automated exposure control, adjustment of the mA and/or kV according to patient size and/or use of iterative reconstruction technique. COMPARISON:  June 22, 2016. FINDINGS: CT HEAD FINDINGS Brain: No evidence of acute infarction, hemorrhage, hydrocephalus, extra-axial collection or mass lesion/mass effect. Vascular: No hyperdense vessel or unexpected calcification. Skull: Normal. Negative for fracture or focal lesion. Sinuses/Orbits: No acute finding. Other: None. CT CERVICAL SPINE FINDINGS Alignment: Minimal grade 1 anterolisthesis of C4-5 is noted secondary to posterior facet joint hypertrophy. Skull base and vertebrae: No acute fracture. No primary bone lesion or focal pathologic process. Soft tissues and spinal canal: No prevertebral fluid or swelling. No visible canal hematoma. Disc levels: Severe degenerative disc disease is noted at C6-7 with anterior posterior osteophyte formation. Upper chest: Negative. Other: None. IMPRESSION: No acute intracranial abnormality seen. Severe degenerative disc disease is noted at C6-7. No acute abnormality seen in the cervical spine. Electronically Signed    By: Marijo Conception M.D.   On: 05/13/2021 16:02    Procedures Procedures    Medications Ordered in ED Medications  sodium chloride 0.9 % bolus 1,000 mL (1,000 mLs Intravenous New Bag/Given 05/13/21 1642)  potassium chloride 10 mEq in 100 mL IVPB (0 mEq Intravenous Stopped 05/13/21 1744)  magnesium sulfate IVPB 2 g 50 mL (0 g Intravenous Stopped 05/13/21 1744)    ED Course/ Medical Decision Making/ A&P                           Medical Decision Making 66 year old female with HER2 positive invasive ductal carcinoma only undergoing HER2 treatment who presents to the ED today with recurrent falls x1 week, worsening tremors, increased fatigue, decreased appetite.  No ED visits over the weekend for similar complaints.  Found to have hypokalemia of 3.0 and repleted and ultimately discharged home.  On arrival to the ED today vitals are stable.  Patient is afebrile, nontachycardic and nontachypneic.  She appears to be no acute distress at this time.  She does report she fell yesterday and hit her head.  She is noted to have a hematoma to the crown of her head with tenderness palpation.  She also has tenderness palpation to the coccyx area and left ribs.  We will plan for x-rays and CT head/CT C-spine given age.  He is no focal neurodeficits on exam today.  Moving all extremities without difficulty.  He does have mild tremors which appears to be recurrent with previous episodes and thought to be related to chemotherapy.  Sees neurology for same.  We will plan for labs at this time with complaint of increasing fatigue, decreased appetite, recurrent falls to assess for any electrolyte derangements.  Add on CK as well as troponin. Will provide fluids.   Labs with findings consistent with hypokalemia and hypomagnesemia as well as slightly elevated CK at 271 however not consistent with true rhabdomyolysis. Will plan for volume repletion as well as IV K and IV mag.   Remainder of workup reassuring. On reeval pt  resting comfortably. Have encouraged increasing fluid intake at home and continuation of potassium supplementation. Has been prescribed  20 meQ daily. Pt has appointment with oncology on Monday. Advised to keep. Pt and husband in agreement with plan and pt stable for discharge home.   Problems Addressed: Dehydration: acute illness or injury Fall, initial encounter: acute illness or injury Hypokalemia: acute illness or injury Hypomagnesemia: acute illness or injury Weakness: acute illness or injury  Amount and/or Complexity of Data Reviewed Labs: ordered.    Details: CBC without leukocytosis. Hgb stable at 13.7.  CMP with potassium 3.2 (3.0 on 02/12, taking home potassium). Chloride 97. Glucose 161. No other electrolyte abnormalities.  Magnesium low at 1.6.  Troponin of 6.  CK elevated at 271 (64 on 02/12). Radiology: ordered.    Details: CT head and CT C spine negative Xray ribs negative Xray coccyx negative  Risk Prescription drug management.          Final Clinical Impression(s) / ED Diagnoses Final diagnoses:  Fall, initial encounter  Weakness  Hypokalemia  Hypomagnesemia  Dehydration    Rx / DC Orders ED Discharge Orders     None        Discharge Instructions      Please follow up with your oncologist as scheduled on Monday.   It is advised that you continue taking the potassium supplement as prescribed. It appears the oncologist prescribed 20 meq once daily for potassium.   You should have both your potassium and magnesium levels rechecked on Monday.   Please increase the amount of water you drink as you appeared dehydrated today.   Return to the ED for any new/worsening symptoms         Eustaquio Maize, PA-C 12/39/35 [id number]    Lianne Cure, DO 07/28/54 1516

## 2021-05-13 NOTE — ED Triage Notes (Addendum)
Pt arrives with family who reports patient has been having more frequent falls. Pt had a fall about 8 weeks ago breaking her leg. Pt was seen at [facility] on Saturday night and given some potassium. Husband reports she has been very weak and shaking. Pt is receiving treatments for cancer at this time. Husband gave tylenol PTA and her oral potassium. Reports pain in buttocks after fall and head states that she did hit her head with her most recent fall. Denies LOC, not on blood thinners.

## 2021-05-13 NOTE — Discharge Instructions (Signed)
Please follow up with your oncologist as scheduled on Monday.   It is advised that you continue taking the potassium supplement as prescribed. It appears the oncologist prescribed 20 meq once daily for potassium.   You should have both your potassium and magnesium levels rechecked on Monday.   Please increase the amount of water you drink as you appeared dehydrated today.   Return to the ED for any new/worsening symptoms

## 2021-08-19 ENCOUNTER — Emergency Department (HOSPITAL_BASED_OUTPATIENT_CLINIC_OR_DEPARTMENT_OTHER): Payer: Medicare Other

## 2021-08-19 ENCOUNTER — Emergency Department (HOSPITAL_BASED_OUTPATIENT_CLINIC_OR_DEPARTMENT_OTHER)
Admission: EM | Admit: 2021-08-19 | Discharge: 2021-08-19 | Disposition: A | Discharge status: Home / Self Care | Payer: Medicare Other | Attending: Emergency Medicine | Admitting: Emergency Medicine

## 2021-08-19 ENCOUNTER — Other Ambulatory Visit: Payer: Self-pay

## 2021-08-19 ENCOUNTER — Encounter (HOSPITAL_BASED_OUTPATIENT_CLINIC_OR_DEPARTMENT_OTHER): Payer: Self-pay | Admitting: Emergency Medicine

## 2021-08-19 DIAGNOSIS — M79672 Pain in left foot: Secondary | ICD-10-CM | POA: Insufficient documentation

## 2021-08-19 DIAGNOSIS — Z79899 Other long term (current) drug therapy: Secondary | ICD-10-CM | POA: Diagnosis not present

## 2021-08-19 DIAGNOSIS — M25472 Effusion, left ankle: Secondary | ICD-10-CM | POA: Insufficient documentation

## 2021-08-19 DIAGNOSIS — R42 Dizziness and giddiness: Secondary | ICD-10-CM | POA: Insufficient documentation

## 2021-08-19 DIAGNOSIS — Z91040 Latex allergy status: Secondary | ICD-10-CM | POA: Diagnosis not present

## 2021-08-19 DIAGNOSIS — C50919 Malignant neoplasm of unspecified site of unspecified female breast: Secondary | ICD-10-CM | POA: Insufficient documentation

## 2021-08-19 DIAGNOSIS — E119 Type 2 diabetes mellitus without complications: Secondary | ICD-10-CM | POA: Diagnosis not present

## 2021-08-19 DIAGNOSIS — W19XXXA Unspecified fall, initial encounter: Secondary | ICD-10-CM

## 2021-08-19 LAB — URINALYSIS, ROUTINE W REFLEX MICROSCOPIC
Bilirubin Urine: NEGATIVE
Glucose, UA: 100 mg/dL — AB
Hgb urine dipstick: NEGATIVE
Ketones, ur: NEGATIVE mg/dL
Leukocytes,Ua: NEGATIVE
Nitrite: NEGATIVE
Protein, ur: NEGATIVE mg/dL
Specific Gravity, Urine: 1.01 (ref 1.005–1.030)
pH: 6.5 (ref 5.0–8.0)

## 2021-08-19 LAB — CBC WITH DIFFERENTIAL/PLATELET
Abs Immature Granulocytes: 0.02 10*3/uL (ref 0.00–0.07)
Basophils Absolute: 0 10*3/uL (ref 0.0–0.1)
Basophils Relative: 1 %
Eosinophils Absolute: 0.1 10*3/uL (ref 0.0–0.5)
Eosinophils Relative: 1 %
HCT: 39.6 % (ref 36.0–46.0)
Hemoglobin: 13.5 g/dL (ref 12.0–15.0)
Immature Granulocytes: 0 %
Lymphocytes Relative: 18 %
Lymphs Abs: 1.1 10*3/uL (ref 0.7–4.0)
MCH: 31.6 pg (ref 26.0–34.0)
MCHC: 34.1 g/dL (ref 30.0–36.0)
MCV: 92.7 fL (ref 80.0–100.0)
Monocytes Absolute: 0.8 10*3/uL (ref 0.1–1.0)
Monocytes Relative: 13 %
Neutro Abs: 4.1 10*3/uL (ref 1.7–7.7)
Neutrophils Relative %: 67 %
Platelets: 102 10*3/uL — ABNORMAL LOW (ref 150–400)
RBC: 4.27 MIL/uL (ref 3.87–5.11)
RDW: 17.3 % — ABNORMAL HIGH (ref 11.5–15.5)
WBC: 6.1 10*3/uL (ref 4.0–10.5)
nRBC: 0 % (ref 0.0–0.2)

## 2021-08-19 LAB — COMPREHENSIVE METABOLIC PANEL
ALT: 18 U/L (ref 0–44)
AST: 30 U/L (ref 15–41)
Albumin: 3.2 g/dL — ABNORMAL LOW (ref 3.5–5.0)
Alkaline Phosphatase: 109 U/L (ref 38–126)
Anion gap: 7 (ref 5–15)
BUN: 9 mg/dL (ref 8–23)
CO2: 29 mmol/L (ref 22–32)
Calcium: 9.4 mg/dL (ref 8.9–10.3)
Chloride: 101 mmol/L (ref 98–111)
Creatinine, Ser: 0.49 mg/dL (ref 0.44–1.00)
GFR, Estimated: >60 mL/min (ref >60)
Glucose, Bld: 108 mg/dL — ABNORMAL HIGH (ref 70–99)
Potassium: 3.5 mmol/L (ref 3.5–5.1)
Sodium: 137 mmol/L (ref 135–145)
Total Bilirubin: 0.9 mg/dL (ref 0.3–1.2)
Total Protein: 6.6 g/dL (ref 6.5–8.1)

## 2021-08-19 LAB — CK: Total CK: 87 U/L (ref 38–234)

## 2021-08-19 LAB — MAGNESIUM: Magnesium: 1.9 mg/dL (ref 1.7–2.4)

## 2021-08-19 MED ORDER — HEPARIN SOD (PORK) LOCK FLUSH 100 UNIT/ML IV SOLN
500.0000 [IU] | Freq: Once | INTRAVENOUS | Status: AC
  Administered 2021-08-19: 500 [IU]
  Filled 2021-08-19: qty 5

## 2021-08-19 MED ORDER — LACTATED RINGERS IV BOLUS
1000.0000 mL | Freq: Once | INTRAVENOUS | Status: AC
  Administered 2021-08-19: 1000 mL via INTRAVENOUS

## 2021-08-19 MED ORDER — OXYCODONE-ACETAMINOPHEN 5-325 MG PO TABS
1.0000 | ORAL_TABLET | Freq: Once | ORAL | Status: AC
  Administered 2021-08-19: 1 via ORAL
  Filled 2021-08-19: qty 1

## 2021-08-19 NOTE — ED Provider Notes (Signed)
[facility]PART[facility] Provider Note   CSN: [id number] Arrival date & time: 08/19/21  1408     History  Chief Complaint  Patient presents with   Foot Pain    left   Fall   Dizziness    Julie Chen is a 66 y.o. female stage 1a breast cancer receiving chemotherapy, well-controlled type 2 diabetes, neuropathy, and history of hypomagnesemia/ hypokalemia on supplementation.   She states that she was getting ready to head to bed last night when she felt dizzy after standing up and fell over air stopper for door. She did hit her head. She remembers all events of episode. She was not able to get up on her own and remained on the ground from 2300 until about 0700 when her husband found her. Her left foot has pain from midfoot to lateral ankle. She has been having frequent episodes of dizziness over the last few days when she stands up.   She denies chest pain, palpitations, shortness of breath, and dysuria. She endorses decreased appetite and dizziness.   Home Medications Prior to Admission medications   Medication Sig Start Date End Date Taking? Authorizing Provider  ALPRAZolam Duanne Moron) 0.25 MG tablet Take 0.25 mg by mouth at bedtime as needed for anxiety.   Yes [provider]  Calcium Acetate, Phos Binder, (CALCIUM ACETATE PO) Take by mouth daily.   Yes [provider]  escitalopram (LEXAPRO) 20 MG tablet Take 20 mg by mouth daily.   Yes [provider]  Fish Oil OIL by Does not apply route daily.   Yes [provider]  fluconazole (DIFLUCAN) 150 MG tablet Take 1 tablet (150 mg total) by mouth daily. 12/06/15  Yes Caryl Ada K, PA-C  fluticasone (FLONASE) 50 MCG/ACT nasal spray Place 1 spray into both nostrils daily. 06/23/16  Yes Pollina, Gwenyth Allegra, MD  folic acid (FOLVITE) 1 MG tablet Take 1 mg by mouth daily.   Yes [provider]  gabapentin (NEURONTIN) 300 MG capsule Take 300 mg by mouth 4 (four) times daily.    Yes [provider]  HYDROcodone-acetaminophen (NORCO/VICODIN) 5-325 MG per tablet Take 1-2 tablets by mouth every 6 (six) hours as needed. 12/07/14  Yes Montine Circle, PA-C  hydrOXYzine (VISTARIL) 25 MG capsule Take 25 mg by mouth 3 (three) times daily as needed.   Yes [provider]  levofloxacin (LEVAQUIN) 500 MG tablet Take 1 tablet (500 mg total) by mouth daily. 12/06/15  Yes Caryl Ada K, PA-C  magnesium 30 MG tablet Take 250 mg by mouth 2 (two) times daily.   Yes [provider]  meclizine (ANTIVERT) 25 MG tablet Take 25 mg by mouth 3 (three) times daily as needed for dizziness.   Yes [provider]  meloxicam (MOBIC) 7.5 MG tablet Take 7.5 mg by mouth daily.   Yes [provider]  montelukast (SINGULAIR) 10 MG tablet Take 10 mg by mouth at bedtime.   Yes [provider]  oxyCODONE-acetaminophen (PERCOCET/ROXICET) 5-325 MG tablet Take 1 tablet by mouth every 6 (six) hours as needed for severe pain. 10/20/20  Yes Volanda Napoleon, PA-C  pantoprazole (PROTONIX) 40 MG tablet Take 1 tablet by mouth daily. 07/22/21  Yes [provider]  potassium chloride SA (K-DUR,KLOR-CON) 20 MEQ tablet Take 1 tablet (20 mEq total) by mouth daily. 06/23/16  Yes Pollina, Gwenyth Allegra, MD  rizatriptan (MAXALT-MLT) 5 MG disintegrating tablet Take 1 tablet (5 mg total) by mouth as needed for migraine. May  repeat in 2 hours if needed 10/01/13  Yes Marcial Pacas, MD  rosuvastatin (CRESTOR) 40 MG tablet Take 40 mg by mouth daily.   Yes [provider]  vitamin A 7500 UNIT capsule Take 7,500 Units by mouth daily.   Yes [provider]  potassium chloride 20 MEQ TBCR Take 40 mEq by mouth daily for 4 days. 10/20/20 10/24/20  Volanda Napoleon, PA-C      Allergies    Chantix [varenicline], Metformin and related, Phenergan [promethazine hcl], Sulfa antibiotics, Aspirin, Canagliflozin, Latex, Levofloxacin, and Sulfasalazine    Review of Systems    Review of Systems  Neurological:  Positive for dizziness.   Physical Exam Updated Vital Signs BP 113/74 (BP Location: Left Arm)   Pulse 69   Temp 98 F (36.7 C) (Oral)   Resp 18   Ht '5\' 7"'$  (1.702 m)   Wt 72.1 kg   SpO2 98%   BMI 24.90 kg/m  Physical Exam Constitutional:      Appearance: She is normal weight.  Cardiovascular:     Rate and Rhythm: Normal rate and regular rhythm.     Pulses: Normal pulses.     Heart sounds: Normal heart sounds.     Comments: Port in place with no erythema or edema Pulmonary:     Effort: Pulmonary effort is normal.     Breath sounds: Normal breath sounds.  Abdominal:     General: Abdomen is flat. Bowel sounds are normal.     Palpations: Abdomen is soft.  Musculoskeletal:        General: Tenderness and signs of injury present.     Cervical back: Normal range of motion.     Left lower leg: Edema present.     Comments: Swelling of left ankle and foot, no erythma or ecchomosis. Tenderness along lateral foot and malleolus, 2+ dorsalis pedis pulses  Neurological:     General: No focal deficit present.     Mental Status: She is alert and oriented to person, place, and time.    ED Results / Procedures / Treatments   Labs (all labs ordered are listed, but only abnormal results are displayed) Labs Reviewed  COMPREHENSIVE METABOLIC PANEL - Abnormal; Notable for the following components:      Result Value   Glucose, Bld 108 (*)    Albumin 3.2 (*)    All other components within normal limits  URINALYSIS, ROUTINE W REFLEX MICROSCOPIC - Abnormal; Notable for the following components:   Glucose, UA 100 (*)    All other components within normal limits  CBC WITH DIFFERENTIAL/PLATELET - Abnormal; Notable for the following components:   RDW 17.3 (*)    Platelets 102 (*)    All other components within normal limits  CK  MAGNESIUM  TSH    EKG EKG Interpretation  Date/Time:  Thursday Aug 19 2021 14:44:12 EDT Ventricular Rate:  76 PR  Interval:  185 QRS Duration: 96 QT Interval:  403 QTC Calculation: 454 R Axis:   0 Text Interpretation: Sinus rhythm when compared to prior, similar appearance but less PVC. NO STEMI Confirmed by Antony Blackbird [phone]) on 08/19/2021 3:11:55 PM  Radiology DG Ankle Complete Left  Result Date: 08/19/2021 CLINICAL DATA:  Edema.  Fall. EXAM: LEFT ANKLE COMPLETE - 3+ VIEW COMPARISON:  None Available. FINDINGS: There is no evidence of fracture, dislocation, or joint effusion. Joint spaces are maintained. Plantar calcaneal spur is present. There is soft tissue swelling surrounding the ankle. IMPRESSION: No acute fracture or  malalignment. Electronically Signed   By: Ronney Asters M.D.   On: 08/19/2021 17:07   DG Foot Complete Left  Result Date: 08/19/2021 CLINICAL DATA:  Trauma, fall EXAM: LEFT FOOT - COMPLETE 3+ VIEW COMPARISON:  None Available. FINDINGS: There is oblique fracture in the base of fifth metatarsal. There is no significant displacement of fracture fragments. There is deformity in the distal shaft of proximal phalanx of fifth toe. There is also deformity in the necks of left fourth and fifth metatarsals. There is minimal deformity in the bases of proximal phalanges of third and fourth toes without definite radiolucent line, possibly residual from previous injury. Small bony spurs are noted in first metatarsophalangeal joint. Plantar spur is seen in the calcaneus. There is bony protuberance in the dorsal aspect of anterior talus or possibly bony spur or residual change from previous injury. Plantar spur is seen in calcaneus. IMPRESSION: Undisplaced fracture is seen in the base of left fifth metatarsal. Deformity in the distal shaft of proximal phalanx of left fifth toe may suggest recent or old undisplaced fracture. Deformities are seen in the necks of left fourth and fifth metatarsals suggesting recent or old fractures. There is minimal deformity in the bases of proximal phalanges of third and  fourth toes without definite radiolucent line, possibly residual from previous injury. Electronically Signed   By: Elmer Picker M.D.   On: 08/19/2021 16:09    Procedures N/A  Medications Ordered in ED Medications  lactated ringers bolus 1,000 mL (1,000 mLs Intravenous New Bag/Given 08/19/21 1629)  oxyCODONE-acetaminophen (PERCOCET/ROXICET) 5-325 MG per tablet 1 tablet (1 tablet Oral Given 08/19/21 1630)    ED Course/ Medical Decision Making/ A&P                           Medical Decision Making  Fall Patient presents with fall after standing up and feeling dizzy.  She reports drinking mostly tea and Dr. Malachi Bonds at home. Differentials include hypovolemia leading to weakness, arrhythmia, infection, electrolyte abnormality, or anemia. EKG showed normal sinus rhythm. Lab evaluation showed CBC, CMP, CK, and UA within normal limits. She was given 1 L of LR. I think that likely that she was dehydrated and fell due this.   Left lower extremity pain XR showed undisplaced fracture seen in the base of left fifth metatarsal. She came in with orthopedic boot in place from prior fracture. I reviewed results with patient and asked her to follow-up with ortho in 2-3 days.  Final Clinical Impression(s) / ED Diagnoses Final diagnoses:  Fall, initial encounter  Foot pain, left    Rx / DC Orders ED Discharge Orders     None         Christiana Fuchs, DO 08/19/21 1850    Tegeler, Gwenyth Allegra, MD 08/20/21 [phone]
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
## 2021-08-19 NOTE — ED Notes (Signed)
Pt informed this RN and Dr. Sherry Ruffing that she does not want a CT of her head. States that everything is fine. Dr. Sherry Ruffing informed pt of potential risks of not getting imaging.

## 2021-08-19 NOTE — Discharge Instructions (Signed)
Ms. Novicki,  You came in after falling yesterday.  I think that you were likely dehydrated and this made you feel dizzy leading to the fall. Your xray of your foot showed a fracture in the 5th metatarsal. Please continue to wear your boot and go to your orthopedic provider in the next couple of days.  Best,  Dr. Christiana Fuchs, DO

## 2021-08-19 NOTE — ED Notes (Signed)
X-ray at bedside

## 2021-08-19 NOTE — ED Notes (Signed)
Pt and husband verbalized understanding of d/c instructions and follow up care. Pt wheeled out of ED via wheelchair.

## 2021-08-19 NOTE — ED Notes (Signed)
Patient states that she fell last night and hurt her foot. States that her ankle is hurting on the left outer side

## 2021-08-20 LAB — TSH: TSH: 4.655 u[IU]/mL — ABNORMAL HIGH (ref 0.350–4.500)

## 2021-10-26 IMAGING — DX DG KNEE COMPLETE 4+V*R*
4 series · 4 of 4 positions shown · non-contrast
Comparison: None.

CLINICAL DATA: Bilateral hip and knee pain for few days, no injury

EXAM:
LEFT KNEE - COMPLETE 4+ VIEW; RIGHT KNEE - COMPLETE 4+ VIEW

[knee ap]
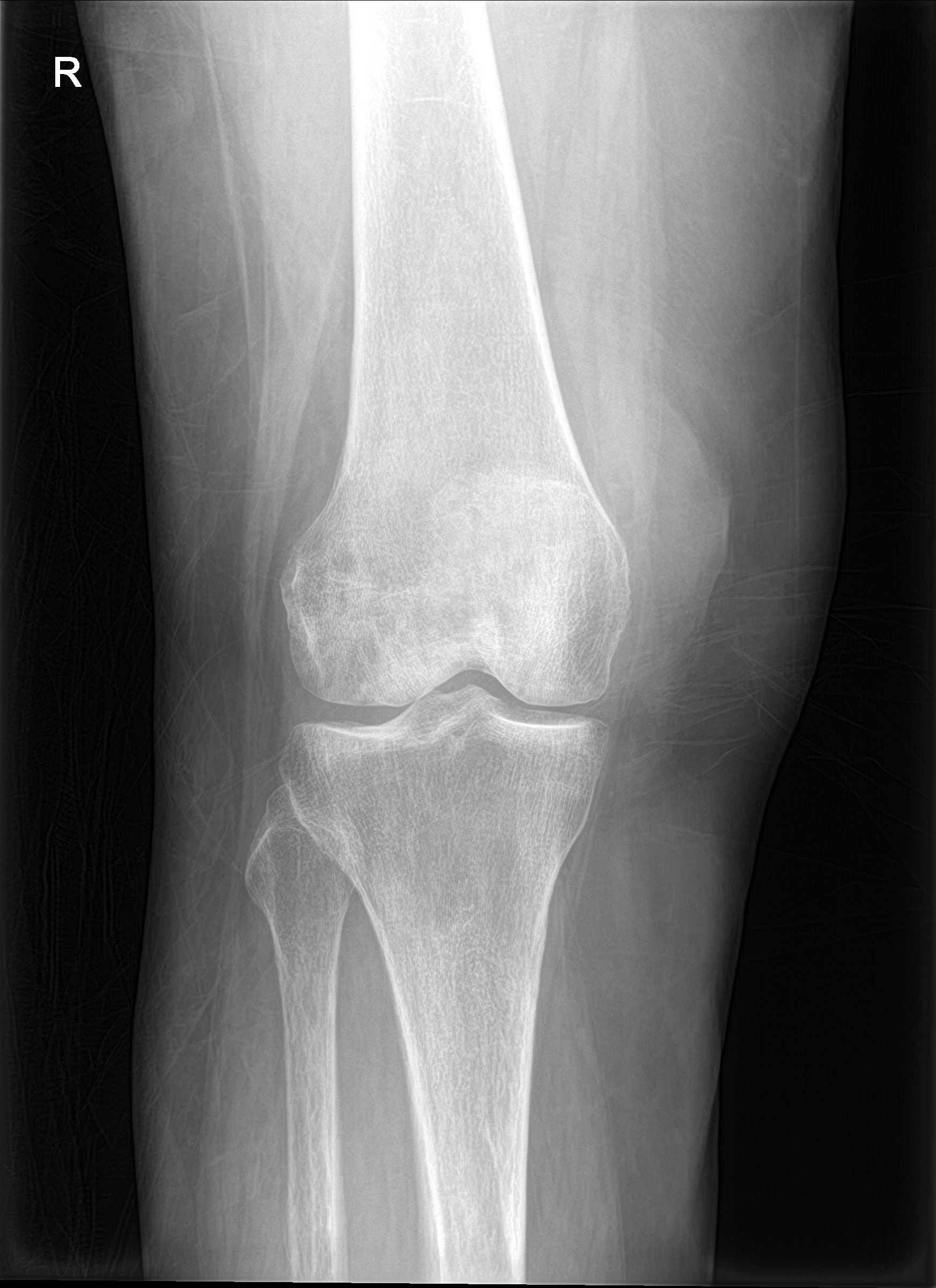

[knee lat]
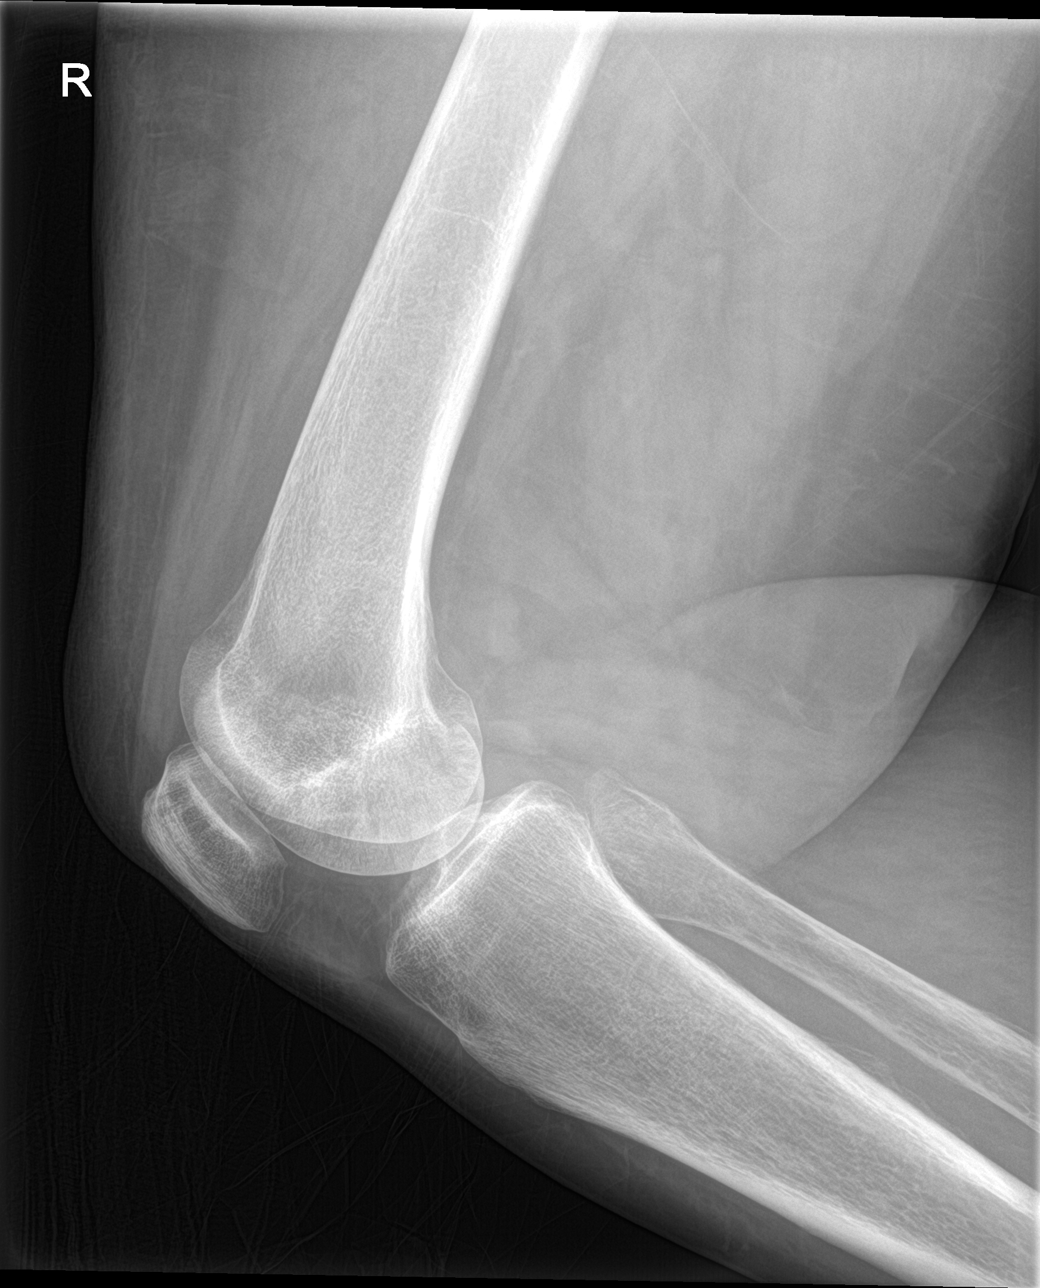

[knee obl (1 of 2)]
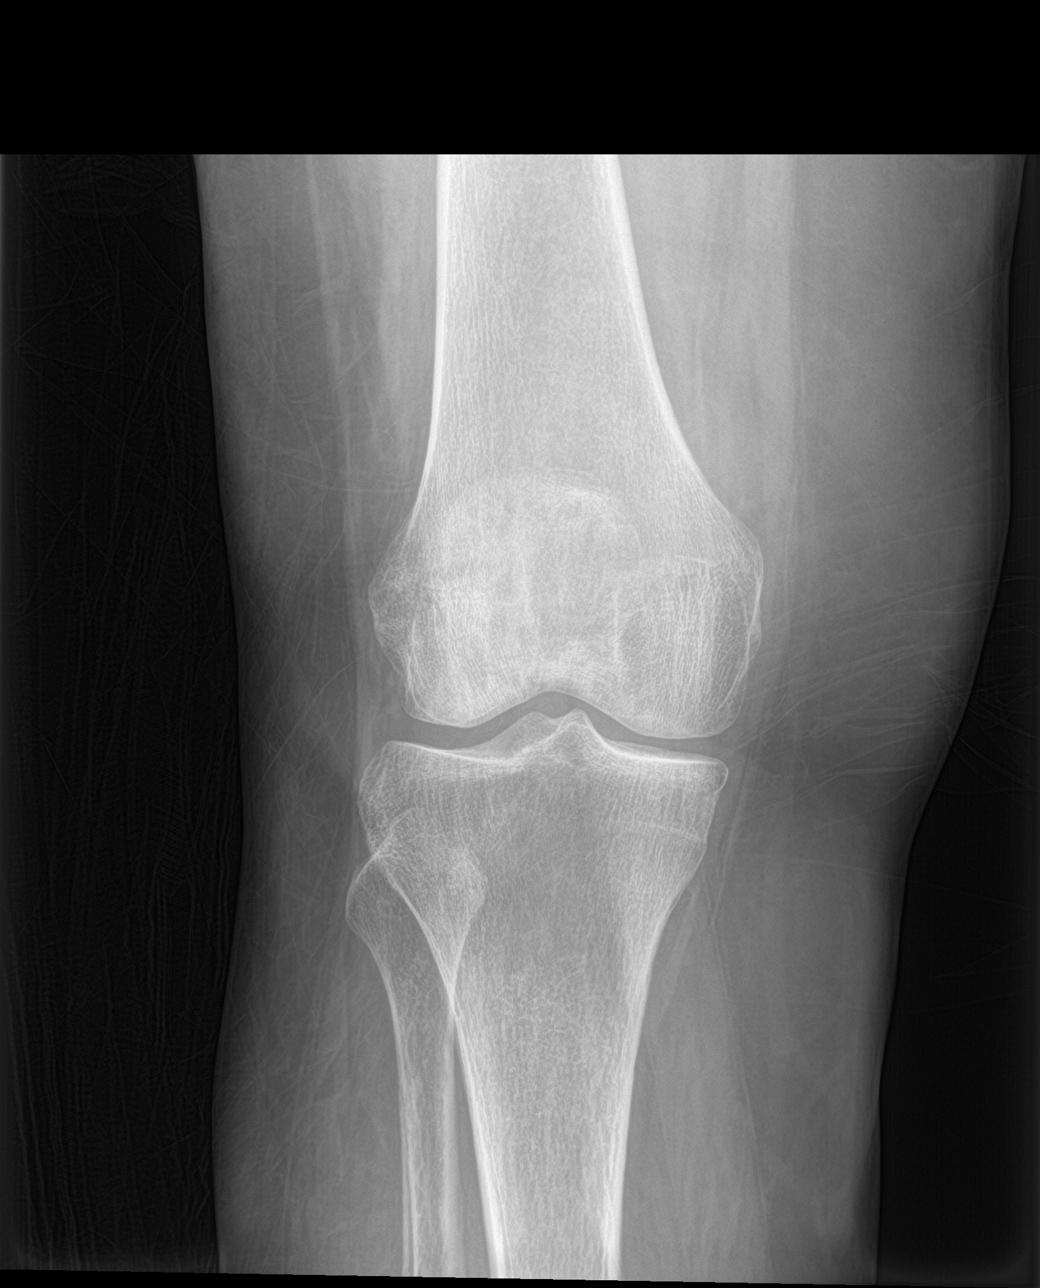

[knee obl (2 of 2)]
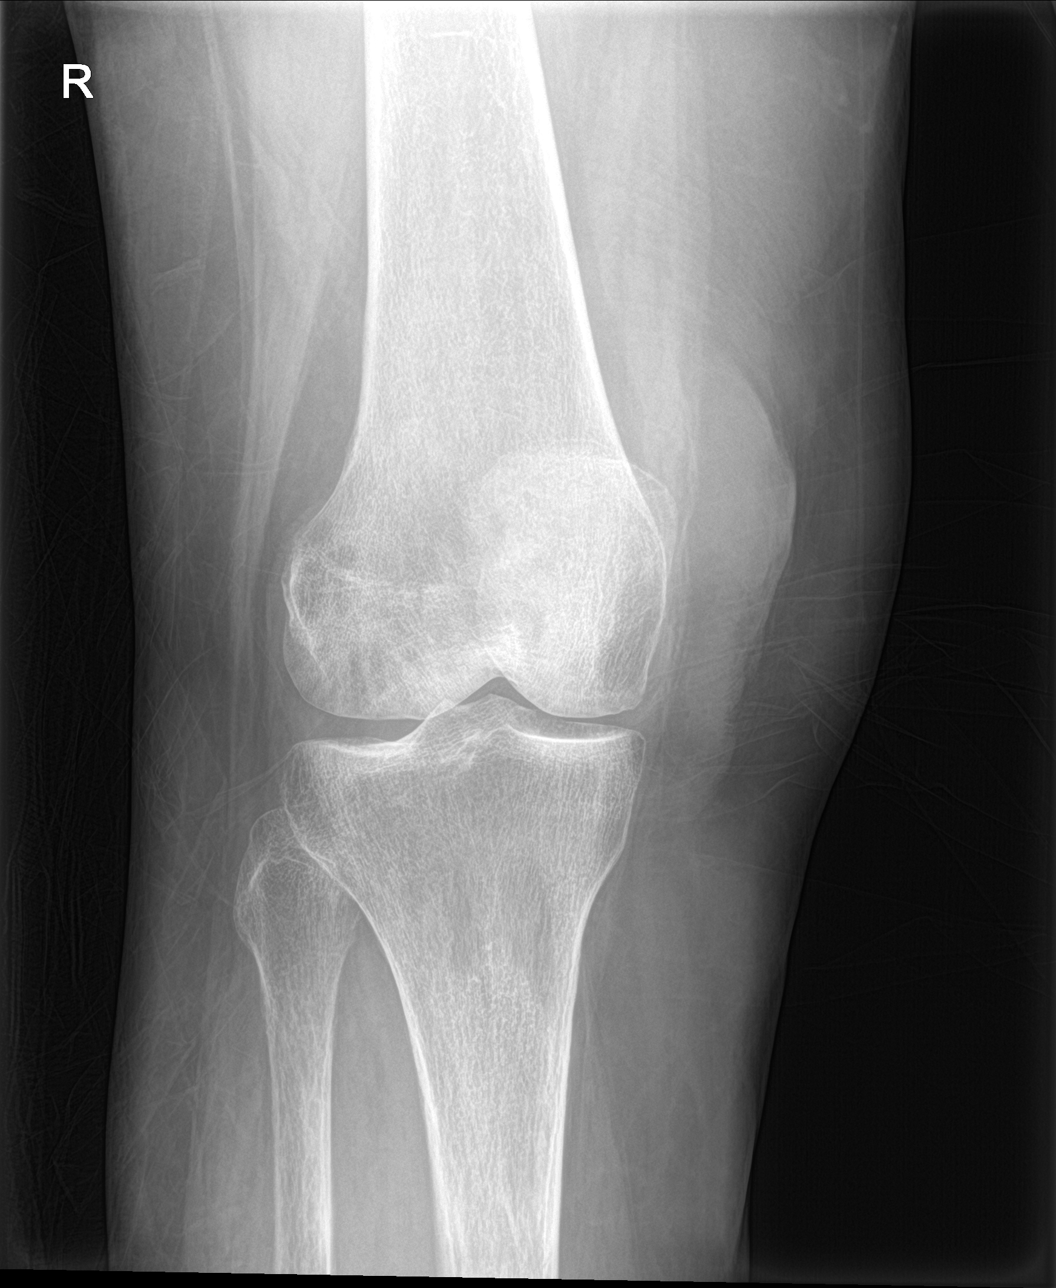

[4 of 4 positions shown; findings below may reference images not displayed]

FINDINGS: Osteopenia. No evidence of fracture, dislocation, or joint effusion.
No evidence of arthropathy or other focal bone abnormality. Soft
tissues are unremarkable.
IMPRESSION: 1.  Osteopenia. No fracture or dislocation of the bilateral knees.

2.  Joint spaces are preserved.

3.  No knee joint effusion.

## 2022-01-13 ENCOUNTER — Emergency Department (HOSPITAL_BASED_OUTPATIENT_CLINIC_OR_DEPARTMENT_OTHER): Payer: Medicare Other

## 2022-01-13 ENCOUNTER — Other Ambulatory Visit: Payer: Self-pay

## 2022-01-13 ENCOUNTER — Emergency Department (HOSPITAL_BASED_OUTPATIENT_CLINIC_OR_DEPARTMENT_OTHER)
Admission: EM | Admit: 2022-01-13 | Discharge: 2022-01-13 | Disposition: A | Discharge status: Home / Self Care | Payer: Medicare Other | Attending: Emergency Medicine | Admitting: Emergency Medicine

## 2022-01-13 ENCOUNTER — Encounter (HOSPITAL_BASED_OUTPATIENT_CLINIC_OR_DEPARTMENT_OTHER): Payer: Self-pay | Admitting: Emergency Medicine

## 2022-01-13 DIAGNOSIS — E876 Hypokalemia: Secondary | ICD-10-CM

## 2022-01-13 DIAGNOSIS — R42 Dizziness and giddiness: Secondary | ICD-10-CM

## 2022-01-13 DIAGNOSIS — R7309 Other abnormal glucose: Secondary | ICD-10-CM | POA: Diagnosis not present

## 2022-01-13 DIAGNOSIS — S51812A Laceration without foreign body of left forearm, initial encounter: Secondary | ICD-10-CM | POA: Diagnosis not present

## 2022-01-13 DIAGNOSIS — Y92002 Bathroom of unspecified non-institutional (private) residence single-family (private) house as the place of occurrence of the external cause: Secondary | ICD-10-CM | POA: Insufficient documentation

## 2022-01-13 DIAGNOSIS — D649 Anemia, unspecified: Secondary | ICD-10-CM | POA: Diagnosis not present

## 2022-01-13 DIAGNOSIS — S59912A Unspecified injury of left forearm, initial encounter: Secondary | ICD-10-CM | POA: Diagnosis present

## 2022-01-13 DIAGNOSIS — Z91040 Latex allergy status: Secondary | ICD-10-CM | POA: Insufficient documentation

## 2022-01-13 DIAGNOSIS — W19XXXA Unspecified fall, initial encounter: Secondary | ICD-10-CM

## 2022-01-13 DIAGNOSIS — S20212A Contusion of left front wall of thorax, initial encounter: Secondary | ICD-10-CM

## 2022-01-13 DIAGNOSIS — W182XXA Fall in (into) shower or empty bathtub, initial encounter: Secondary | ICD-10-CM | POA: Insufficient documentation

## 2022-01-13 LAB — BASIC METABOLIC PANEL
Anion gap: 6 (ref 5–15)
BUN: 10 mg/dL (ref 8–23)
CO2: 29 mmol/L (ref 22–32)
Calcium: 8.8 mg/dL — ABNORMAL LOW (ref 8.9–10.3)
Chloride: 102 mmol/L (ref 98–111)
Creatinine, Ser: 0.47 mg/dL (ref 0.44–1.00)
GFR, Estimated: >60 mL/min (ref >60)
Glucose, Bld: 200 mg/dL — ABNORMAL HIGH (ref 70–99)
Potassium: 2.9 mmol/L — ABNORMAL LOW (ref 3.5–5.1)
Sodium: 137 mmol/L (ref 135–145)

## 2022-01-13 LAB — CBC WITH DIFFERENTIAL/PLATELET
Abs Immature Granulocytes: 0.03 10*3/uL (ref 0.00–0.07)
Basophils Absolute: 0 10*3/uL (ref 0.0–0.1)
Basophils Relative: 1 %
Eosinophils Absolute: 0 10*3/uL (ref 0.0–0.5)
Eosinophils Relative: 1 %
HCT: 32.9 % — ABNORMAL LOW (ref 36.0–46.0)
Hemoglobin: 11.1 g/dL — ABNORMAL LOW (ref 12.0–15.0)
Immature Granulocytes: 1 %
Lymphocytes Relative: 24 %
Lymphs Abs: 0.9 10*3/uL (ref 0.7–4.0)
MCH: 30.2 pg (ref 26.0–34.0)
MCHC: 33.7 g/dL (ref 30.0–36.0)
MCV: 89.6 fL (ref 80.0–100.0)
Monocytes Absolute: 0.5 10*3/uL (ref 0.1–1.0)
Monocytes Relative: 12 %
Neutro Abs: 2.3 10*3/uL (ref 1.7–7.7)
Neutrophils Relative %: 61 %
Platelets: 90 10*3/uL — ABNORMAL LOW (ref 150–400)
RBC: 3.67 MIL/uL — ABNORMAL LOW (ref 3.87–5.11)
RDW: 17.2 % — ABNORMAL HIGH (ref 11.5–15.5)
WBC: 3.7 10*3/uL — ABNORMAL LOW (ref 4.0–10.5)
nRBC: 0 % (ref 0.0–0.2)

## 2022-01-13 MED ORDER — POTASSIUM CHLORIDE CRYS ER 20 MEQ PO TBCR
40.0000 meq | EXTENDED_RELEASE_TABLET | Freq: Once | ORAL | Status: AC
  Administered 2022-01-13: 40 meq via ORAL
  Filled 2022-01-13: qty 2

## 2022-01-13 NOTE — Discharge Instructions (Signed)
You were seen in the emergency department for evaluation of dizziness fall and injury to your left chest wall.  Your x-ray did not show any rib fractures or pneumothorax.  You had some lab work done that showed a low potassium and you were given potassium supplement.  Please follow-up with your oncologist and primary care doctor for further evaluation of your dizziness.  Return to the emergency department if any worsening or concerning symptoms.

## 2022-01-13 NOTE — ED Notes (Signed)
Attempted to draw blood as per order and place IV, upon initial process of IV, pt jerked arm and needle came out, pt states she does not understand why she needs lab work or the possiblity of additional radiological studies, states she is having a CT on the 7th of Nov. Will inform provider of pts concern of blood work being done " why cant he just look at my x-rays?'

## 2022-01-13 NOTE — ED Provider Notes (Signed)
[facility]PART[facility] Provider Note   CSN: [id number] Arrival date & time: 01/13/22  1103     History  Chief Complaint  Patient presents with   Lytle Michaels    Julie Chen is a 66 y.o. female.  She is here for evaluation of some left-sided rib pain after a fall 3 days ago.  She said she was in the shower and became dizzy and fell striking her left ribs.  Since then she has had pain with bending twisting turning.  She also has a skin tear on her left forearm.  She has had on and off dizziness for months and is concerned that her oncologist is not doing anything about it.  The history is provided by the patient.  Fall This is a recurrent problem. The current episode started more than 2 days ago. The problem has not changed since onset.Associated symptoms include chest pain. Pertinent negatives include no abdominal pain, no headaches and no shortness of breath. The symptoms are aggravated by bending and twisting. Nothing relieves the symptoms. She has tried rest for the symptoms. The treatment provided no relief.       Home Medications Prior to Admission medications   Medication Sig Start Date End Date Taking? Authorizing Provider  ALPRAZolam Duanne Moron) 0.25 MG tablet Take 0.25 mg by mouth at bedtime as needed for anxiety.    [provider]  Calcium Acetate, Phos Binder, (CALCIUM ACETATE PO) Take by mouth daily.    [provider]  escitalopram (LEXAPRO) 20 MG tablet Take 20 mg by mouth daily.    [provider]  Fish Oil OIL by Does not apply route daily.    [provider]  fluconazole (DIFLUCAN) 150 MG tablet Take 1 tablet (150 mg total) by mouth daily. 12/06/15   Fransico Meadow, PA-C  fluticasone (FLONASE) 50 MCG/ACT nasal spray Place 1 spray into both nostrils daily. 06/23/16   Orpah Greek, MD  folic acid (FOLVITE) 1 MG tablet Take 1 mg by mouth daily.    [provider]  gabapentin (NEURONTIN) 300 MG capsule Take  300 mg by mouth 4 (four) times daily.    [provider]  HYDROcodone-acetaminophen (NORCO/VICODIN) 5-325 MG per tablet Take 1-2 tablets by mouth every 6 (six) hours as needed. 12/07/14   Montine Circle, PA-C  hydrOXYzine (VISTARIL) 25 MG capsule Take 25 mg by mouth 3 (three) times daily as needed.    [provider]  levofloxacin (LEVAQUIN) 500 MG tablet Take 1 tablet (500 mg total) by mouth daily. 12/06/15   Fransico Meadow, PA-C  magnesium 30 MG tablet Take 250 mg by mouth 2 (two) times daily.    [provider]  meclizine (ANTIVERT) 25 MG tablet Take 25 mg by mouth 3 (three) times daily as needed for dizziness.    [provider]  meloxicam (MOBIC) 7.5 MG tablet Take 7.5 mg by mouth daily.    [provider]  montelukast (SINGULAIR) 10 MG tablet Take 10 mg by mouth at bedtime.    [provider]  oxyCODONE-acetaminophen (PERCOCET/ROXICET) 5-325 MG tablet Take 1 tablet by mouth every 6 (six) hours as needed for severe pain. 10/20/20   Volanda Napoleon, PA-C  pantoprazole (PROTONIX) 40 MG tablet Take 1 tablet by mouth daily. 07/22/21   [provider]  potassium chloride 20 MEQ TBCR Take 40 mEq by mouth daily for 4 days. 10/20/20 10/24/20  Providence Lanius A, PA-C  potassium chloride SA (K-DUR,KLOR-CON) 20 MEQ  tablet Take 1 tablet (20 mEq total) by mouth daily. 06/23/16   Orpah Greek, MD  rizatriptan (MAXALT-MLT) 5 MG disintegrating tablet Take 1 tablet (5 mg total) by mouth as needed for migraine. May repeat in 2 hours if needed 10/01/13   Marcial Pacas, MD  rosuvastatin (CRESTOR) 40 MG tablet Take 40 mg by mouth daily.    [provider]  vitamin A 7500 UNIT capsule Take 7,500 Units by mouth daily.    [provider]      Allergies    Chantix [varenicline], Metformin and related, Phenergan [promethazine hcl], Sulfa antibiotics, Aspirin, Canagliflozin, Latex, Levofloxacin, and Sulfasalazine    Review of Systems    Review of Systems  Constitutional:  Negative for fever.  Eyes:  Negative for visual disturbance.  Respiratory:  Negative for shortness of breath.   Cardiovascular:  Positive for chest pain.  Gastrointestinal:  Negative for abdominal pain.  Musculoskeletal:  Negative for neck pain.  Skin:  Positive for wound.  Neurological:  Positive for dizziness. Negative for headaches.    Physical Exam Updated Vital Signs BP 103/67   Pulse 93   Temp 97.9 F (36.6 C) (Oral)   Resp 18   Ht '5\' 8"'$  (1.727 m)   Wt 71.7 kg   SpO2 96%   BMI 24.02 kg/m  Physical Exam Vitals and nursing note reviewed.  Constitutional:      General: She is not in acute distress.    Appearance: Normal appearance. She is well-developed.  HENT:     Head: Normocephalic and atraumatic.  Eyes:     Conjunctiva/sclera: Conjunctivae normal.  Cardiovascular:     Rate and Rhythm: Normal rate and regular rhythm.     Heart sounds: No murmur heard. Pulmonary:     Effort: Pulmonary effort is normal. No respiratory distress.     Breath sounds: Normal breath sounds.  Chest:    Abdominal:     Palpations: Abdomen is soft.     Tenderness: There is no abdominal tenderness. There is no guarding or rebound.  Musculoskeletal:        General: Tenderness present. Normal range of motion.     Cervical back: Neck supple.     Comments: she has some bruising and a small skin tear to her left forearm.  Skin:    General: Skin is warm and dry.     Capillary Refill: Capillary refill takes less than 2 seconds.  Neurological:     General: No focal deficit present.     Mental Status: She is alert.     Sensory: No sensory deficit.     Motor: No weakness.     Gait: Gait normal.     ED Results / Procedures / Treatments   Labs (all labs ordered are listed, but only abnormal results are displayed) Labs Reviewed  BASIC METABOLIC PANEL - Abnormal; Notable for the following components:      Result Value   Potassium 2.9 (*)    Glucose,  Bld 200 (*)    Calcium 8.8 (*)    All other components within normal limits  CBC WITH DIFFERENTIAL/PLATELET - Abnormal; Notable for the following components:   WBC 3.7 (*)    RBC 3.67 (*)    Hemoglobin 11.1 (*)    HCT 32.9 (*)    RDW 17.2 (*)    Platelets 90 (*)    All other components within normal limits    EKG EKG Interpretation  Date/Time:  Thursday January 13 2022  13:16:46 EDT Ventricular Rate:  85 PR Interval:  204 QRS Duration: 108 QT Interval:  414 QTC Calculation: 493 R Axis:   4 Text Interpretation: Sinus rhythm Sinus pause Borderline prolonged QT interval Premature ventricular complexes No significant change since prior 5/23 Confirmed by Aletta Edouard [phone]) on 01/13/2022 1:24:02 PM  Radiology DG Ribs Unilateral W/Chest Left  Result Date: 01/13/2022 CLINICAL DATA:  Left rib pain after fall. EXAM: LEFT RIBS AND CHEST - 3+ VIEW COMPARISON:  May 13, 2021. FINDINGS: No fracture or other bone lesions are seen involving the ribs. There is no evidence of pneumothorax or pleural effusion. Both lungs are clear. Heart size and mediastinal contours are within normal limits. IMPRESSION: Negative. Electronically Signed   By: Marijo Conception M.D.   On: 01/13/2022 12:00    Procedures Procedures    Medications Ordered in ED Medications  potassium chloride SA (KLOR-CON M) CR tablet 40 mEq (40 mEq Oral Given 01/13/22 1339)    ED Course/ Medical Decision Making/ A&P Clinical Course as of 01/13/22 1807  Thu Jan 13, 2022  1324 ECG not crossing into epic, normal sinus rhythm 85 PVC no acute ST-T changes.  No significant change from prior 5/23 [MB]  1404 Patient's blood work showing low potassium of 2.9 elevated glucose.  CBC with pancytopenia.  Hemoglobin lower than baseline.  Does not have absolute neutrophilia.  Given oral potassium. [MB]    Clinical Course User Index [MB] Hayden Rasmussen, MD                           Medical Decision Making Amount and/or  Complexity of Data Reviewed Labs: ordered. Radiology: ordered.  Risk Prescription drug management.   This patient complains of dizziness fall left-sided rib pain; this involves an extensive number of treatment Options and is a complaint that carries with it a high risk of complications and morbidity. The differential includes contusion, fracture, pneumothorax, anemia, infection, metabolic derangement, arrhythmia  I ordered, reviewed and interpreted labs, which included CBC with pancytopenia slightly worse than priors, chemistries with with low potassium elevated glucose.  Patient states she is on potassium supplements already. I ordered medication oral potassium and reviewed PMP when indicated. I ordered imaging studies which included chest x-ray and rib series and I independently    visualized and interpreted imaging which showed no acute fractures Previous records obtained and reviewed in epic including prior oncology notes  Cardiac monitoring reviewed, normal sinus rhythm Social determinants considered, ongoing tobacco use Critical Interventions: None  After the interventions stated above, I reevaluated the patient and found patient to be well-appearing neuro intact Admission and further testing considered, no indications for admission or further work-up at this time.  Patient is comfortable plan for outpatient follow-up with her PCP and oncology team.  Return instructions discussed         Final Clinical Impression(s) / ED Diagnoses Final diagnoses:  Dizziness  Fall, initial encounter  Contusion of left chest wall, initial encounter  Hypokalemia    Rx / DC Orders ED Discharge Orders     None         Hayden Rasmussen, MD 01/13/22 1809

## 2022-01-13 NOTE — ED Notes (Signed)
Pt refuses ECG, states we can look at her chart, she had an ECG 2 weeks ago

## 2022-01-13 NOTE — ED Notes (Signed)
ED MD in to speak with pt, agrees to blood work drawn from her port that is currently in place

## 2022-01-13 NOTE — ED Notes (Signed)
States she fell this past Monday, up to restroom and fell into the shower. States she has been having some dizziness for the past three weeks, but on day of fall denies any chest pain, shortness of breath, or dizziness. Noted to have a large bruise at left bicep, sm faint bruise noted on left side of chest. Has a dsg on left forearm from skin tear per pt statement. Noted to have a pain patch on left deltoid area.  BEFAST/ VAN assessment negative at this time. Gait was noted to be wnl. But as slow to get out of chair in lobby, states she feels sore all over, especially the left side of her chest.

## 2022-01-13 NOTE — ED Triage Notes (Signed)
Reports 3 days ago , she stood from bed going to the bathroom felt dizzy , fell hit the shower, left rib pain , bruising to left upper arm and skin tear forearm , presents with coban pressure dressing .  Chemo pt for breast cancer . Last chemo 1 week ago .

## 2022-03-20 IMAGING — DX DG ANKLE COMPLETE 3+V*R*
3 series · 3 of 3 positions shown · non-contrast
Comparison: None.

CLINICAL DATA: Fall with swelling and pain.

EXAM:
RIGHT TIBIA AND FIBULA - 2 VIEW; RIGHT ANKLE - COMPLETE 3+ VIEW

[ankle ap]
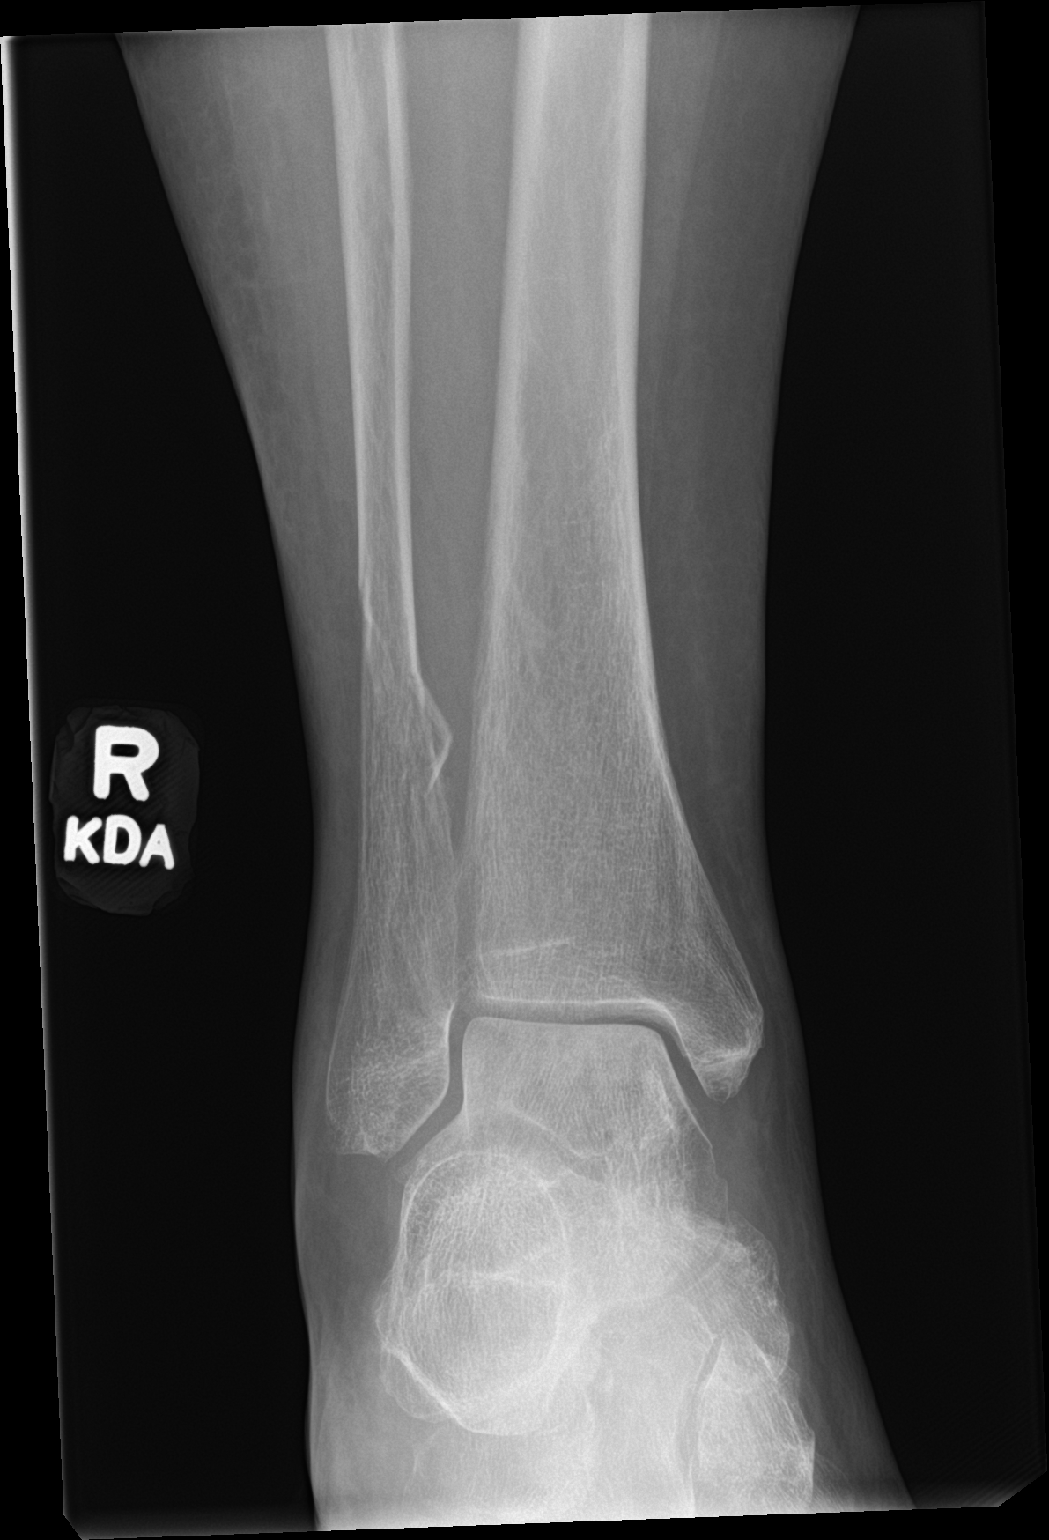

[ankle obl]
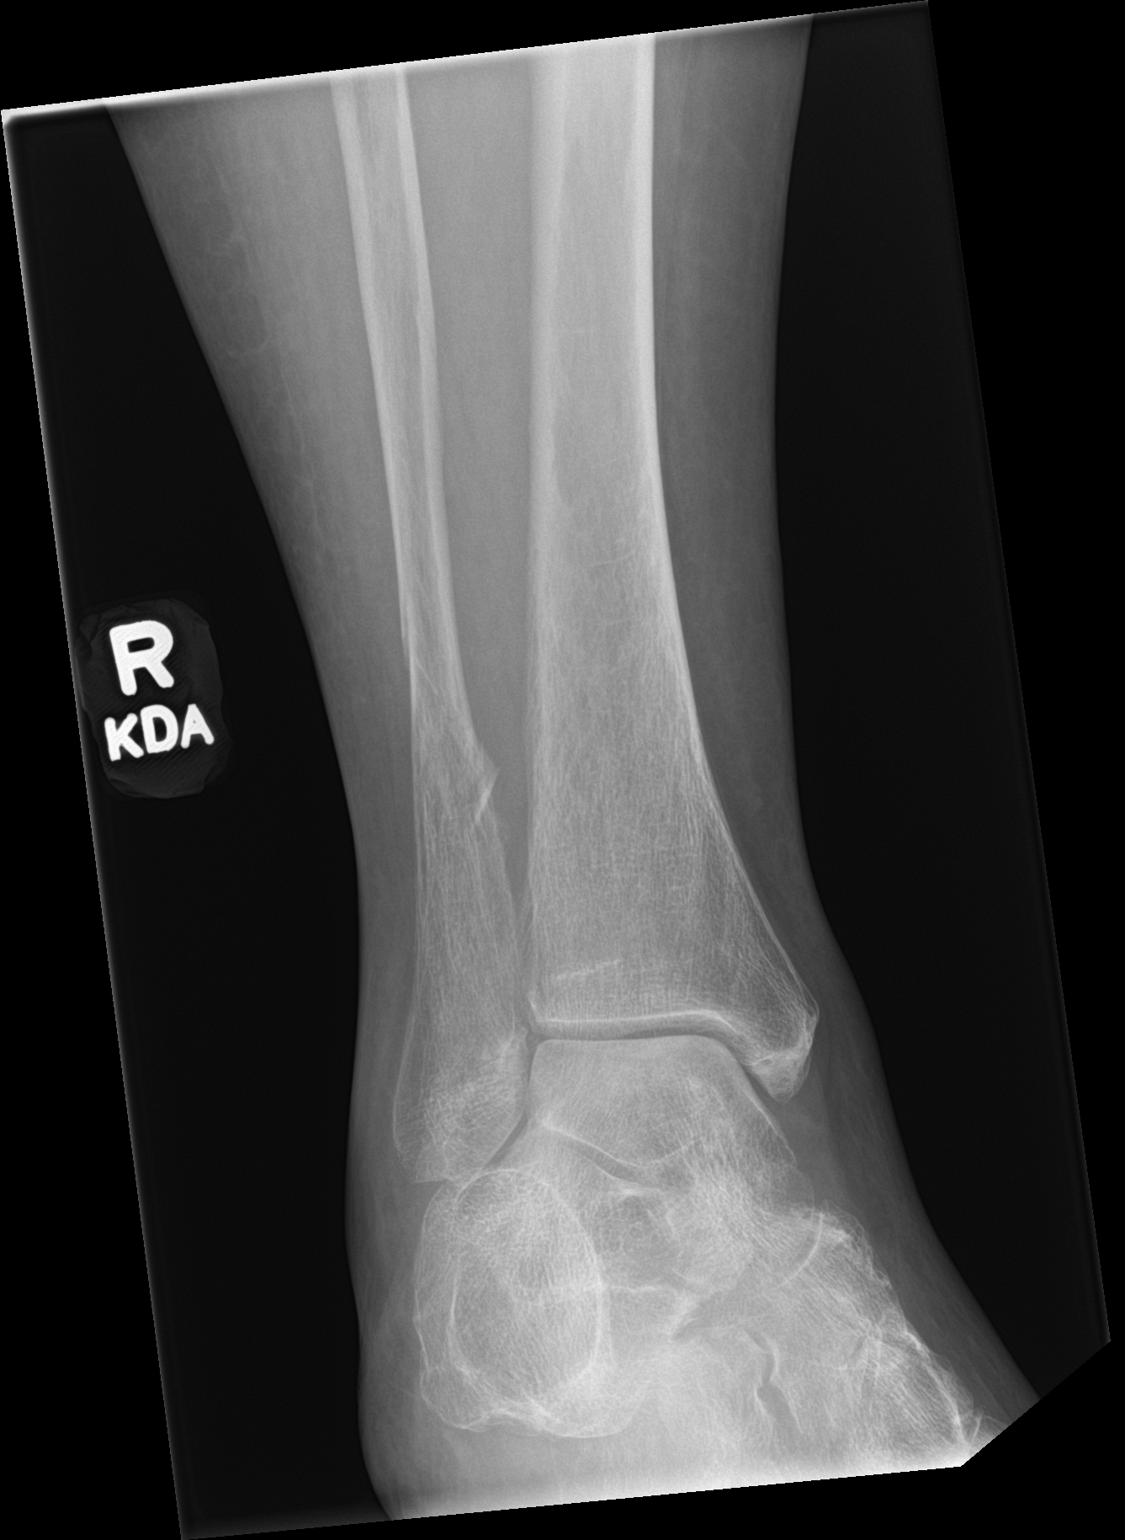

[ankle lat]
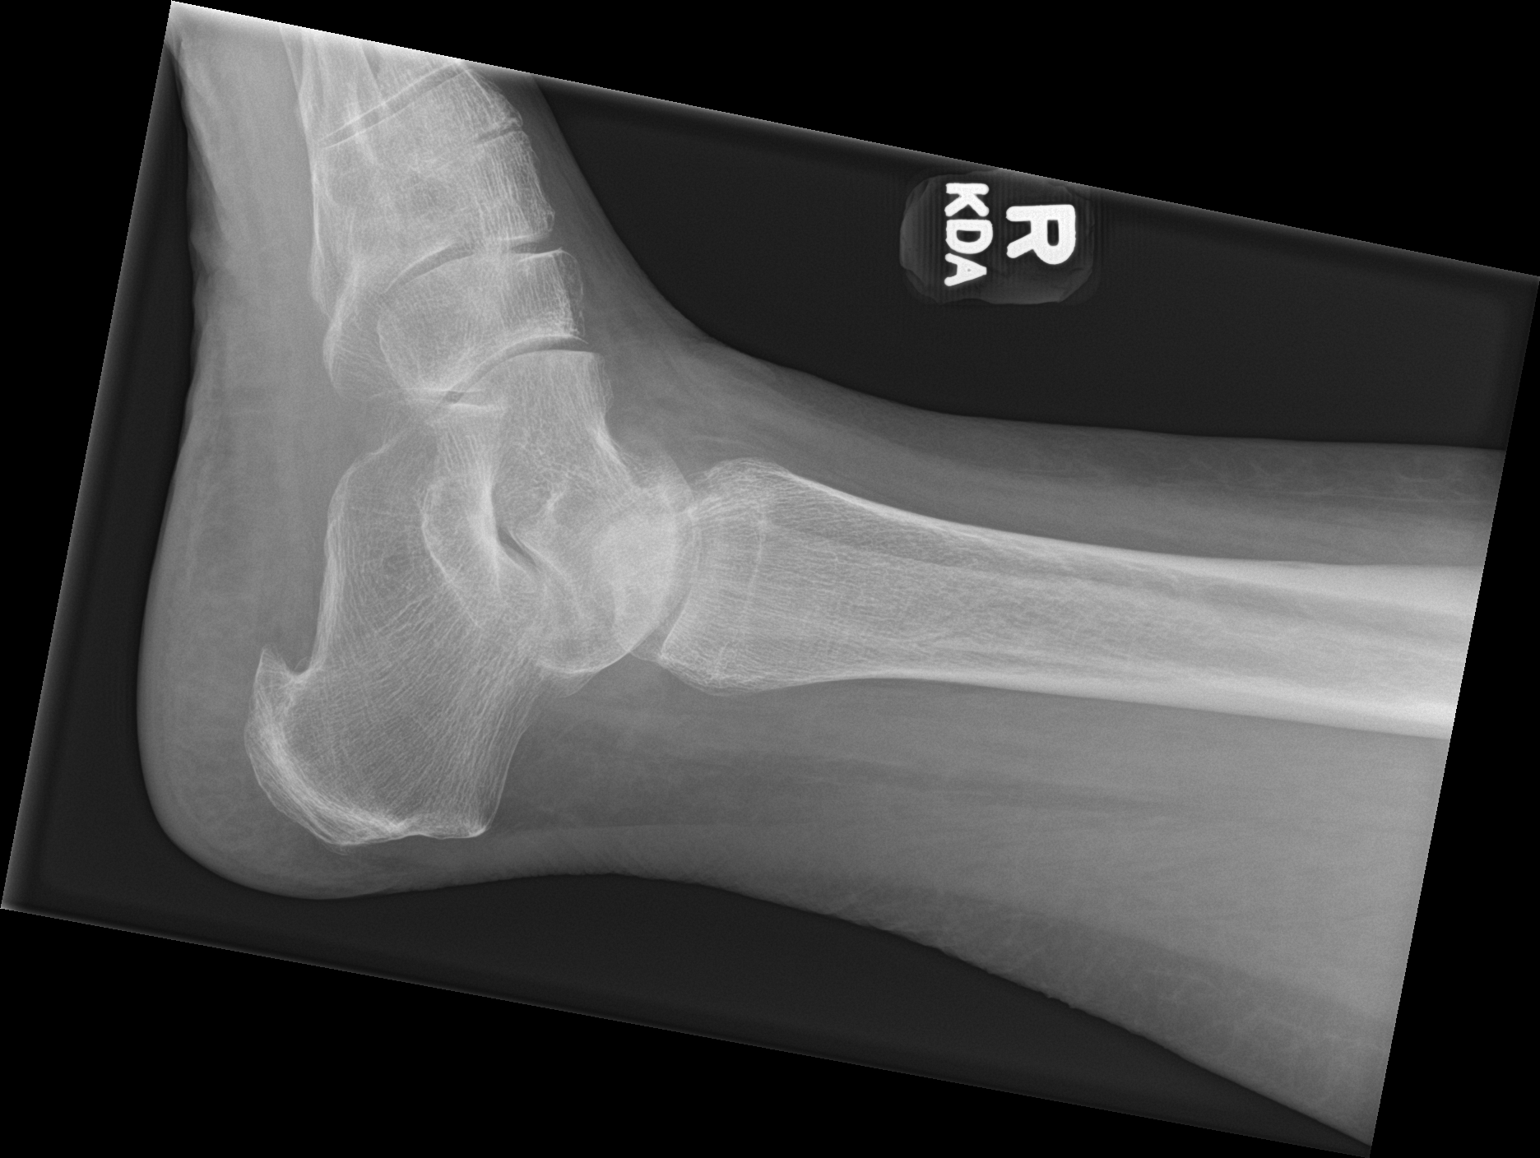

[3 of 3 positions shown; findings below may reference images not displayed]

FINDINGS: There is a minimally displaced fracture of the distal fibular shaft.
No additional acute fracture in the lower leg or ankle. No evidence
of dislocation. Regional soft tissues are unremarkable.
IMPRESSION: Minimally displaced fracture of the distal fibular shaft.

## 2022-08-26 ENCOUNTER — Encounter (HOSPITAL_BASED_OUTPATIENT_CLINIC_OR_DEPARTMENT_OTHER): Payer: Self-pay

## 2022-08-26 ENCOUNTER — Other Ambulatory Visit: Payer: Self-pay

## 2022-08-26 ENCOUNTER — Telehealth (HOSPITAL_BASED_OUTPATIENT_CLINIC_OR_DEPARTMENT_OTHER): Payer: Self-pay

## 2022-08-26 ENCOUNTER — Emergency Department (HOSPITAL_BASED_OUTPATIENT_CLINIC_OR_DEPARTMENT_OTHER): Payer: Medicare HMO

## 2022-08-26 ENCOUNTER — Emergency Department (HOSPITAL_BASED_OUTPATIENT_CLINIC_OR_DEPARTMENT_OTHER)
Admission: EM | Admit: 2022-08-26 | Discharge: 2022-08-26 | Disposition: A | Discharge status: Home / Self Care | Payer: Medicare HMO | Attending: Emergency Medicine | Admitting: Emergency Medicine

## 2022-08-26 DIAGNOSIS — E119 Type 2 diabetes mellitus without complications: Secondary | ICD-10-CM | POA: Insufficient documentation

## 2022-08-26 DIAGNOSIS — Z853 Personal history of malignant neoplasm of breast: Secondary | ICD-10-CM | POA: Diagnosis not present

## 2022-08-26 DIAGNOSIS — Y92410 Unspecified street and highway as the place of occurrence of the external cause: Secondary | ICD-10-CM | POA: Insufficient documentation

## 2022-08-26 DIAGNOSIS — S63502A Unspecified sprain of left wrist, initial encounter: Secondary | ICD-10-CM

## 2022-08-26 DIAGNOSIS — S6992XA Unspecified injury of left wrist, hand and finger(s), initial encounter: Secondary | ICD-10-CM | POA: Diagnosis present

## 2022-08-26 DIAGNOSIS — Z91040 Latex allergy status: Secondary | ICD-10-CM | POA: Insufficient documentation
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
## 2022-08-26 NOTE — ED Notes (Signed)
Cannot discharge, registration in chart.   
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
